# Patient Record
Sex: Male | Born: 1950 | Race: Black or African American | Hispanic: No | Marital: Single | Smoking: Never smoker
Health system: Southern US, Community
[De-identification: ages and names within clinical notes are randomized; demographics above are authoritative.]

## PROBLEM LIST (undated history)

## (undated) DIAGNOSIS — I10 Essential (primary) hypertension: Secondary | ICD-10-CM

## (undated) DIAGNOSIS — H9193 Unspecified hearing loss, bilateral: Secondary | ICD-10-CM

## (undated) HISTORY — PX: ABDOMINAL SURGERY: SHX537

---

## 2013-04-30 ENCOUNTER — Encounter (HOSPITAL_COMMUNITY): Payer: Self-pay | Admitting: Emergency Medicine

## 2013-04-30 ENCOUNTER — Emergency Department (HOSPITAL_COMMUNITY)
Admission: EM | Admit: 2013-04-30 | Discharge: 2013-04-30 | Disposition: A | Discharge status: Home / Self Care | Payer: Self-pay | Attending: Emergency Medicine | Admitting: Emergency Medicine

## 2013-04-30 DIAGNOSIS — R61 Generalized hyperhidrosis: Secondary | ICD-10-CM | POA: Insufficient documentation

## 2013-04-30 DIAGNOSIS — M542 Cervicalgia: Secondary | ICD-10-CM | POA: Insufficient documentation

## 2013-04-30 DIAGNOSIS — R51 Headache: Secondary | ICD-10-CM | POA: Insufficient documentation

## 2013-04-30 MED ORDER — IBUPROFEN 800 MG PO TABS: 800.0000 mg | ORAL_TABLET | Freq: Three times a day (TID) | ORAL | Status: DC

## 2013-04-30 MED ORDER — METHOCARBAMOL 500 MG PO TABS: 500.0000 mg | ORAL_TABLET | Freq: Two times a day (BID) | ORAL | Status: DC

## 2013-04-30 NOTE — ED Notes (Addendum)
Pt is deaf states neck has been hurting x  4 days had a bee sting him in [place] in sept has hurt since no injury no fever

## 2013-04-30 NOTE — ED Notes (Signed)
PT IS DEAF. C/o neck pain x 2 days.

## 2013-04-30 NOTE — ED Provider Notes (Signed)
CSN: [id number]     Arrival date & time 04/30/13  1319 History  This chart was scribed for Bobby Sites, PA working with Bobby Canal, MD by Quintella Reichert, ED Scribe. This patient was seen in room TR07C/TR07C and the patient's care was started at 3:22 PM.   Chief Complaint  Patient presents with  . Neck Pain    The history is provided by the patient and the spouse. The history is limited by a language barrier (pt is deaf). A language interpreter was used.   HPI Comments: Bobby Griffith is a 62 y.o. male who presents to the Emergency Department complaining of 4 days of moderate neck pain.  Hx obtained using sign language interpreter.  Pt states he was stung on the back of his neck by a bee in September and pain has been intensifying ever since.  Pain is worse with movement in any direction and seems to be radiating down the back of his neck.  States he has intermittent headaches over the past month and has felt sweaty at times but has not checked his temperature.  States he let his wife massage his neck but this made pain worse.  Has been having difficulty sleeping due to pain.  Denies any numbness or paresthesias of UE.  No other neck injuries.  No prior neck problems.  Has been taking ibuprofen at home without significant relief.  Denies any dizziness, confusion, unsteady gait, or visual disturbance.  Pt wears glasses at all times.  Pt afebrile on arrival.  History reviewed. No pertinent past medical history.  History reviewed. No pertinent past surgical history.  No family history on file.   History  Substance Use Topics  . Smoking status: Never Smoker   . Smokeless tobacco: Not on file  . Alcohol Use: No     Review of Systems  Eyes: Negative for visual disturbance.  Musculoskeletal: Positive for neck pain.  Neurological: Positive for headaches.  All other systems reviewed and are negative.     Allergies  Review of patient's allergies indicates no known allergies.  Home  Medications  No current outpatient prescriptions on file.  BP 119/78  Pulse 75  Temp(Src) 97 F (36.1 C) (Oral)  Resp 20  Ht 6' (1.829 m)  Wt 162 lb 3.2 oz (73.573 kg)  BMI 21.99 kg/m2  SpO2 98%  Physical Exam  Nursing note and vitals reviewed. Constitutional: He is oriented to person, place, and time. He appears well-developed and well-nourished. No distress.  HENT:  Head: Normocephalic and atraumatic.  Right Ear: Tympanic membrane and ear Griffith normal.  Left Ear: Tympanic membrane and ear Griffith normal.  Nose: Nose normal.  Mouth/Throat: Uvula is midline, oropharynx is clear and moist and mucous membranes are normal. No oropharyngeal exudate, posterior oropharyngeal edema or posterior oropharyngeal erythema.  Eyes: Conjunctivae and EOM are normal. Pupils are equal, round, and reactive to light.  Neck: Normal range of motion. Neck supple. No rigidity.  No nuchal rigidity; TTP of left posterior neck; full ROM maintained but with some pain, worse with turning his head to the right; strong radial pulses bilaterally; sensation intact; no bite marks identified  Cardiovascular: Normal rate, regular rhythm and normal heart sounds.   Pulmonary/Chest: Effort normal and breath sounds normal.  Musculoskeletal: Normal range of motion.  Neurological: He is alert and oriented to person, place, and time. He has normal strength. He displays no tremor. No cranial nerve deficit or sensory deficit. He displays no seizure activity. Gait normal.  CN grossly intact, moves all extremities appropriately without ataxia, no focal neuro deficits or facial droop appreciated  Skin: Skin is warm and dry. He is not diaphoretic.  Psychiatric: He has a normal mood and affect.  Sign language only    ED Course  Procedures (including critical care time)  DIAGNOSTIC STUDIES: Oxygen Saturation is 98% on room air, normal by my interpretation.    COORDINATION OF CARE: 3:24 PM-Discussed treatment plan which  includes consult with attending physician with pt at bedside and pt agreed to plan.    Labs Review Labs Reviewed - No data to display  Imaging Review No results found.  EKG Interpretation   None       MDM   1. Neck pain    Pt afebrile in the ED, neuro exam without noted deficits.  Some wincing with turning his head, worse when turning to the right.  Case discussed with Dr. Silverio Lay-- advised sx sound musculoskeletal in nature and should try anti-inflammatories and muscle relaxant.  Pt does not have PCP at this time.  If no improvement in the next few days, [facility].  Given strict return precautions should sx worsen or change including inability to turn head/move neck, numbness or weakness of extremities, high fever, or severe headache.  I personally performed the services described in this documentation, which was scribed in my presence. The recorded information has been reviewed and is accurate.  Garlon Hatchet, PA-C 04/30/13 1929

## 2013-04-30 NOTE — ED Provider Notes (Signed)
Medical screening examination/treatment/procedure(s) were performed by non-physician practitioner and as supervising physician I was immediately available for consultation/collaboration.  EKG Interpretation   None         David H Yao, MD 04/30/13 2234 

## 2017-06-17 ENCOUNTER — Encounter (HOSPITAL_COMMUNITY): Payer: Self-pay

## 2017-06-17 ENCOUNTER — Emergency Department (HOSPITAL_COMMUNITY)
Admission: EM | Admit: 2017-06-17 | Discharge: 2017-06-17 | Disposition: A | Discharge status: Home / Self Care | Payer: Medicare Other | Attending: Emergency Medicine | Admitting: Emergency Medicine

## 2017-06-17 ENCOUNTER — Emergency Department (HOSPITAL_COMMUNITY): Payer: Medicare Other

## 2017-06-17 ENCOUNTER — Other Ambulatory Visit: Payer: Self-pay

## 2017-06-17 DIAGNOSIS — R52 Pain, unspecified: Secondary | ICD-10-CM

## 2017-06-17 DIAGNOSIS — M79604 Pain in right leg: Secondary | ICD-10-CM | POA: Insufficient documentation

## 2017-06-17 DIAGNOSIS — L299 Pruritus, unspecified: Secondary | ICD-10-CM | POA: Insufficient documentation

## 2017-06-17 DIAGNOSIS — M7918 Myalgia, other site: Secondary | ICD-10-CM | POA: Insufficient documentation

## 2017-06-17 DIAGNOSIS — S8391XA Sprain of unspecified site of right knee, initial encounter: Secondary | ICD-10-CM | POA: Diagnosis not present

## 2017-06-17 LAB — I-STAT CHEM 8, ED
BUN: 18 mg/dL (ref 6–20)
CALCIUM ION: 1.18 mmol/L (ref 1.15–1.40)
CHLORIDE: 102 mmol/L (ref 101–111)
Creatinine, Ser: 0.9 mg/dL (ref 0.61–1.24)
GLUCOSE: 89 mg/dL (ref 65–99)
HCT: 49 % (ref 39.0–52.0)
Hemoglobin: 16.7 g/dL (ref 13.0–17.0)
POTASSIUM: 4.2 mmol/L (ref 3.5–5.1)
Sodium: 140 mmol/L (ref 135–145)
TCO2: 25 mmol/L (ref 22–32)

## 2017-06-17 MED ORDER — DIPHENHYDRAMINE HCL 25 MG PO TABS: 25.0000 mg | ORAL_TABLET | Freq: Four times a day (QID) | ORAL | 0 refills | Status: DC | PRN

## 2017-06-17 MED ORDER — DOXYCYCLINE HYCLATE 100 MG PO CAPS: 100.0000 mg | ORAL_CAPSULE | Freq: Two times a day (BID) | ORAL | 0 refills | Status: DC

## 2017-06-17 MED ORDER — CYCLOBENZAPRINE HCL 5 MG PO TABS: 5.0000 mg | ORAL_TABLET | Freq: Three times a day (TID) | ORAL | 0 refills | Status: DC | PRN

## 2017-06-17 MED ORDER — CYCLOBENZAPRINE HCL 5 MG PO TABS: 10.0000 mg | ORAL_TABLET | Freq: Two times a day (BID) | ORAL | 0 refills | Status: DC | PRN

## 2017-06-17 MED ORDER — PERMETHRIN 5 % EX CREA: TOPICAL_CREAM | CUTANEOUS | 1 refills | Status: DC

## 2017-06-17 NOTE — ED Notes (Signed)
Sign Language Interpreter Called @ 1700-per RN

## 2017-06-17 NOTE — ED Provider Notes (Signed)
[facility] EMERGENCY [facility] Provider Note   CSN: [id number] Arrival date & time: 06/17/17  1655     History   Chief Complaint Chief Complaint  Patient presents with  . Leg Pain    HPI Bobby Griffith is a 66 y.o. male.  The history is provided by the patient and medical records. A language interpreter was used Heritage manager(Sign Language Interpreter).  Leg Pain   Pertinent negatives include no numbness.   Bobby Griffith is a 66 y.o. male  who presents to the Emergency Department complaining of intermittent right upper thigh pain x 4 days. Patient describes the pain as a cramping sensation, like a charley horse. Symptoms occur more frequently at night but will also occur a few times during the day as well. When he is not having the cramping sensation, the upper leg just feels sore. No known injury or inciting event. Denies history of similar. Patient additionally reports itching all over for the last few days as well. He saw several bugs in his bed so he washed all of his sheets and sprayed disinfectant spray on the mattress.  He pulled 3 or 4 bugs off of his abdomen.  He is unsure what kind of bugs these were.  Itching has somewhat improved, but still bothering him.  No one else with similar symptoms.  No medications taken prior to arrival for symptoms.  No fever, chills, numbness, tingling, weakness, abdominal pain, nausea, vomiting, back pain.   History reviewed. No pertinent past medical history.  There are no active problems to display for this patient.   History reviewed. No pertinent surgical history.     Home Medications    Prior to Admission medications   Medication Sig Start Date End Date Taking? Authorizing Provider  cyclobenzaprine (FLEXERIL) 5 MG tablet Take 1 tablet (5 mg total) by mouth 3 (three) times daily as needed for muscle spasms. 06/17/17   Ward, Chase PicketJaime Pilcher, PA-C  diphenhydrAMINE (BENADRYL) 25 MG tablet Take 1 tablet (25 mg total) by mouth  every 6 (six) hours as needed for itching. 06/17/17   Ward, Chase PicketJaime Pilcher, PA-C  doxycycline (VIBRAMYCIN) 100 MG capsule Take 1 capsule (100 mg total) by mouth 2 (two) times daily. 06/17/17   Ward, Chase PicketJaime Pilcher, PA-C  permethrin (ELIMITE) 5 % cream Apply to entire body from neck down at night. Rinse off in the morning. Repeat in 1 week if symptoms are not resolved. 06/17/17   Ward, Chase PicketJaime Pilcher, PA-C    Family History History reviewed. No pertinent family history.  Social History Social History   Tobacco Use  . Smoking status: Not on file  Substance Use Topics  . Alcohol use: No    Frequency: Never  . Drug use: Not on file     Allergies   Patient has no known allergies.   Review of Systems Review of Systems  Constitutional: Negative for chills, fatigue and fever.  Respiratory: Negative for shortness of breath.   Gastrointestinal: Negative for abdominal pain, nausea and vomiting.  Musculoskeletal: Positive for myalgias. Negative for back pain.  Skin: Positive for rash. Negative for color change.  Neurological: Negative for dizziness, weakness, numbness and headaches.     Physical Exam Updated Vital Signs BP (!) 149/98 (BP Location: Right Arm)   Pulse (!) 54   Temp 98.1 F (36.7 C) (Oral)   Resp 16   SpO2 99%   Physical Exam  Constitutional: He appears well-developed and well-nourished. No distress.  HENT:  Head: Normocephalic and  atraumatic.  Neck: Neck supple.  Cardiovascular: Normal rate, regular rhythm and normal heart sounds.  No murmur heard. Pulmonary/Chest: Effort normal and breath sounds normal. No respiratory distress. He has no wheezes. He has no rales.  Abdominal: Soft. He exhibits no distension. There is no tenderness.  Musculoskeletal:       Legs: Tenderness to palpation as depicted in image.  No overlying skin changes.  5/5 muscle strength and full range of motion of bilateral lower extremities.  2+ DP.  Sensation intact.  Neurological: He is  alert.  Skin: Skin is warm and dry. No rash noted.  Nursing note and vitals reviewed.    ED Treatments / Results  Labs (all labs ordered are listed, but only abnormal results are displayed) Labs Reviewed  I-STAT CHEM 8, ED    EKG  EKG Interpretation None       Radiology Dg Femur, Min 2 Views Right  Result Date: 06/17/2017 CLINICAL DATA:  Twisted leg at work EXAM: RIGHT FEMUR 2 VIEWS COMPARISON:  None. FINDINGS: No fracture or malalignment. Posterior cortical lucency mid femoral shaft on the lateral view, favor nutrient vessel. Soft tissues are unremarkable. IMPRESSION: No acute osseous abnormality. Electronically Signed   By: Jasmine PangKim  Fujinaga M.D.   On: 06/17/2017 18:45    Procedures Procedures (including critical care time)  Medications Ordered in ED Medications - No data to display   Initial Impression / Assessment and Plan / ED Course  I have reviewed the triage vital signs and the nursing notes.  Pertinent labs & imaging results that were available during my care of the patient were reviewed by me and considered in my medical decision making (see chart for details).    Bobby Griffith is a 66 y.o. male who presents to ED for intermittent right anterior thigh cramping x 4 days. Tenderness to anterior aspect of the leg, but no overlying skin changes. Full ROM and 5/5 strength. NVI. X-ray negative. Chem-8 wdl. Discussed symptomatic home care instructions. Rx for flexeril. Increase hydration, rest. Patient also endorses generalized itching and seeing small bugs in his bed. He then describes pulling 3 or 4 bugs off of his abdomen. No rash on exam but does have small scarring to the abdomen in the areas where he states he pulled bugs off. Hard to differentiate possible tick vs. Bed bugs. Will cover for both and give rx for doxy as well as permethrin.   PCP follow up strongly encouraged and resources provided in discharge summary. Return precautions discussed with interpreter. All  questions answered.   Patient seen by and discussed with Dr. Clayborne DanaMesner who agrees with treatment plan.    Final Clinical Impressions(s) / ED Diagnoses   Final diagnoses:  Right leg pain  Itching    ED Discharge Orders        Ordered    permethrin (ELIMITE) 5 % cream     06/17/17 2036    diphenhydrAMINE (BENADRYL) 25 MG tablet  Every 6 hours PRN     06/17/17 2036    cyclobenzaprine (FLEXERIL) 5 MG tablet  2 times daily PRN,   Status:  Discontinued     06/17/17 2036    cyclobenzaprine (FLEXERIL) 5 MG tablet  3 times daily PRN     06/17/17 2037    doxycycline (VIBRAMYCIN) 100 MG capsule  2 times daily     06/17/17 2039       Ward, Chase PicketJaime Pilcher, PA-C 06/17/17 2046    Mesner, Barbara CowerJason, MD 06/18/17 [id number]

## 2017-06-17 NOTE — ED Notes (Signed)
Pt called x1 with no answer.  

## 2017-06-17 NOTE — Discharge Instructions (Signed)
It was my pleasure taking care of you today!   Fortunately, your x-ray and blood work today was normal.   Flexeril (cyclobenzaprine) is your muscle relaxer which you can take up to 3 times a day as needed for your right leg pain.  Drinking more water and resting will also help with this.  You can apply ice or heat for additional pain relief.  Please take all of your antibiotics until finished! This is to make sure the bites do not get infected or cause complications.  Benadryl as needed for itching.  Permethrin cream as directed.   Please follow-up with your primary doctor if symptoms persist.  If you do not have a primary care doctor, please see the information below.  Return to ER for new or worsening symptoms, any additional concerns.  To find a primary care or specialty doctor please call [phone] or [phone] to access "[facility] Find a Doctor Service."  You may also go on the [facility] website at [email or web address]  There are also multiple [facility] and [facility] practices throughout the [place] that are frequently accepting new patients. You may find a clinic that is close to your home and contact them.  [facility] - [address] [place] [id number] [phone]  [facility] in [place] (also locations in [place] and [place]) - [address] [place] [place] [phone]  [facility] - [address] [place] [phone]

## 2017-06-17 NOTE — ED Triage Notes (Signed)
Using [employer] interpreter, pt states he twisted his leg at work on Wednesday and has had persistent pain. Pt ambulatory with a limp. Pt denies any other health problems or allergies to medications.

## 2017-06-17 NOTE — ED Triage Notes (Signed)
Pt is deaf. In person interpreter has not arrived yet and the [employer] interpreter is not working. Pt has a note with him that say she is here for leg pain, pt points to the right femur.

## 2017-06-17 NOTE — ED Notes (Signed)
Patient transported to X-ray 

## 2017-06-18 DIAGNOSIS — Z23 Encounter for immunization: Secondary | ICD-10-CM | POA: Diagnosis not present

## 2018-02-20 ENCOUNTER — Observation Stay (HOSPITAL_COMMUNITY)
Admission: EM | Admit: 2018-02-20 | Discharge: 2018-02-23 | Disposition: A | Discharge status: Home / Self Care | Payer: Medicare Other | Attending: Internal Medicine | Admitting: Internal Medicine

## 2018-02-20 DIAGNOSIS — R300 Dysuria: Secondary | ICD-10-CM | POA: Insufficient documentation

## 2018-02-20 DIAGNOSIS — R079 Chest pain, unspecified: Secondary | ICD-10-CM | POA: Diagnosis not present

## 2018-02-20 DIAGNOSIS — R410 Disorientation, unspecified: Secondary | ICD-10-CM | POA: Diagnosis not present

## 2018-02-20 DIAGNOSIS — Z9889 Other specified postprocedural states: Secondary | ICD-10-CM | POA: Insufficient documentation

## 2018-02-20 DIAGNOSIS — I10 Essential (primary) hypertension: Secondary | ICD-10-CM | POA: Diagnosis not present

## 2018-02-20 DIAGNOSIS — R112 Nausea with vomiting, unspecified: Secondary | ICD-10-CM | POA: Diagnosis not present

## 2018-02-20 DIAGNOSIS — Z79899 Other long term (current) drug therapy: Secondary | ICD-10-CM | POA: Diagnosis not present

## 2018-02-20 DIAGNOSIS — I5030 Unspecified diastolic (congestive) heart failure: Secondary | ICD-10-CM | POA: Insufficient documentation

## 2018-02-20 DIAGNOSIS — I208 Other forms of angina pectoris: Secondary | ICD-10-CM | POA: Diagnosis present

## 2018-02-20 DIAGNOSIS — I25118 Atherosclerotic heart disease of native coronary artery with other forms of angina pectoris: Principal | ICD-10-CM | POA: Insufficient documentation

## 2018-02-20 DIAGNOSIS — R0789 Other chest pain: Secondary | ICD-10-CM | POA: Diagnosis not present

## 2018-02-20 DIAGNOSIS — I2584 Coronary atherosclerosis due to calcified coronary lesion: Secondary | ICD-10-CM | POA: Diagnosis not present

## 2018-02-20 DIAGNOSIS — H9190 Unspecified hearing loss, unspecified ear: Secondary | ICD-10-CM | POA: Diagnosis not present

## 2018-02-20 DIAGNOSIS — Z202 Contact with and (suspected) exposure to infections with a predominantly sexual mode of transmission: Secondary | ICD-10-CM | POA: Diagnosis not present

## 2018-02-20 DIAGNOSIS — L299 Pruritus, unspecified: Secondary | ICD-10-CM | POA: Diagnosis not present

## 2018-02-20 HISTORY — DX: Unspecified hearing loss, bilateral: H91.93

## 2018-02-20 MED ORDER — NITROGLYCERIN 0.4 MG SL SUBL: 0.4000 mg | SUBLINGUAL_TABLET | SUBLINGUAL | Status: DC | PRN

## 2018-02-20 NOTE — ED Provider Notes (Signed)
[facility] EMERGENCY DEPARTMENT Provider Note   CSN: [id number] Arrival date & time: 02/20/18  2320     History   Chief Complaint No chief complaint on file.   HPI Bobby Griffith is a 67 y.o. male.  The history is provided by the patient. A language interpreter was used Estate manager/land agent).  He had onset at 10:30 PM of severe chest pain radiating to the left shoulder.  He describes it as a burning feeling.  It was worse with walking, better when he sat and rested.  There was diaphoresis but no dyspnea or nausea.  He is never had pain like this before.  He tried taking acetaminophen without relief.  Ambulance gave him aspirin and nitroglycerin with moderate relief.  He currently rates his discomfort at 5/10.  He is a non-smoker without any history of diabetes or hypertension or hyperlipidemia.  There is no family history of premature coronary atherosclerosis.  No past medical history on file.  There are no active problems to display for this patient.   No past surgical history on file.      Home Medications    Prior to Admission medications   Medication Sig Start Date End Date Taking? Authorizing Provider  ibuprofen (ADVIL,MOTRIN) 800 MG tablet Take 1 tablet (800 mg total) by mouth 3 (three) times daily. 04/30/13   Garlon Hatchet, PA-C  methocarbamol (ROBAXIN) 500 MG tablet Take 1 tablet (500 mg total) by mouth 2 (two) times daily. 04/30/13   Garlon Hatchet, PA-C    Family History No family history on file.  Social History Social History   Tobacco Use  . Smoking status: Never Smoker  Substance Use Topics  . Alcohol use: No  . Drug use: Not on file     Allergies   Patient has no known allergies.   Review of Systems Review of Systems  All other systems reviewed and are negative.    Physical Exam Updated Vital Signs BP (!) 153/86 (BP Location: Right Arm)   Pulse 77   Temp 98.7 F (37.1 C) (Oral)   Resp (!) 21   SpO2 100%    Physical Exam  Nursing note and vitals reviewed.  67 year old male, resting comfortably and in no acute distress. Vital signs are significant for elevated systolic blood pressure. Oxygen saturation is 100%, which is normal. Head is normocephalic and atraumatic. PERRLA, EOMI. Oropharynx is clear. Neck is nontender and supple without adenopathy or JVD. Back is nontender and there is no CVA tenderness. Lungs are clear without rales, wheezes, or rhonchi. Chest is nontender. Heart has regular rate and rhythm without murmur. Abdomen is soft, flat, nontender without masses or hepatosplenomegaly and peristalsis is normoactive. Extremities have no cyanosis or edema, full range of motion is present. Skin is warm and dry without rash. Neurologic: Mental status is normal, cranial nerves are intact, there are no motor or sensory deficits.  ED Treatments / Results  Labs (all labs ordered are listed, but only abnormal results are displayed) Labs Reviewed  BASIC METABOLIC PANEL - Abnormal; Notable for the following components:      Result Value   Glucose, Bld 103 (*)    All other components within normal limits  CBC WITH DIFFERENTIAL/PLATELET  I-STAT TROPONIN, ED    EKG EKG Interpretation  Date/Time:  Monday February 20 2018 23:33:17 EDT Ventricular Rate:  83 PR Interval:    QRS Duration: 93 QT Interval:  386 QTC Calculation: 454 R Axis:  74 Text Interpretation:  Sinus rhythm Left atrial enlargement Borderline repolarization abnormality No old tracing to compare Confirmed by Dione BoozeGlick, Kynnedy Carreno ([id number]) on 02/20/2018 11:46:41 PM   Radiology Dg Chest 2 View  Result Date: 02/21/2018 CLINICAL DATA:  67 y/o M; centralized chest pain radiating to the left shoulder. EXAM: CHEST - 2 VIEW COMPARISON:  None. FINDINGS: The heart size and mediastinal contours are within normal limits. Both lungs are clear. The visualized skeletal structures are unremarkable. IMPRESSION: No active cardiopulmonary disease.  Electronically Signed   By: Mitzi HansenLance  Furusawa-Stratton M.D.   On: 02/21/2018 00:25    Procedures Procedures  Medications Ordered in ED Medications  nitroGLYCERIN (NITROSTAT) SL tablet 0.4 mg (has no administration in time range)     Initial Impression / Assessment and Plan / ED Course  I have reviewed the triage vital signs and the nursing notes.  Pertinent labs & imaging results that were available during my care of the patient were reviewed by me and considered in my medical decision making (see chart for details).  Chest discomfort of uncertain cause.  With exertional component, certainly need to be worried about possible coronary artery disease.  He has no known risk factors, but heart score is 4 which puts him at moderate risk for major adverse cardiac events in the next 6 weeks.  ECG does show some nonspecific repolarization abnormalities with no prior ECGs available for comparison.  Old records are reviewed, and he has no relevant past records.  Given history and borderline ECG changes, I do feel he will need to be admitted for serial troponins.  2:58 AM Patient states that he is pain-free.  As a matter of fact, I had to wake him up to ask him if he was still having pain.  Initial troponin is normal.  Chest x-ray is unremarkable.  Case is discussed with Dr. Katrinka BlazingSmith of [facility], who agrees to admit the patient.  Final Clinical Impressions(s) / ED Diagnoses   Final diagnoses:  Chest pain, unspecified type    ED Discharge Orders    None       Dione BoozeGlick, Georgiann Neider, MD 02/21/18 [phone]

## 2018-02-20 NOTE — ED Triage Notes (Signed)
Per EMS, pt has hx of being deaf and is coming to ER today with complaints of centralized chest pain that radiates to his left shoulder. Pt stated it started around 1030 pm after he ate. Pt denies N/V/D. Pt received x1 nitro for pain and 324 ASA. Pt pain is 5/10.

## 2018-02-21 ENCOUNTER — Encounter (HOSPITAL_COMMUNITY): Payer: Self-pay | Admitting: Internal Medicine

## 2018-02-21 ENCOUNTER — Emergency Department (HOSPITAL_COMMUNITY): Payer: Medicare Other

## 2018-02-21 ENCOUNTER — Observation Stay (HOSPITAL_COMMUNITY): Payer: Self-pay

## 2018-02-21 ENCOUNTER — Other Ambulatory Visit: Payer: Self-pay

## 2018-02-21 ENCOUNTER — Observation Stay (HOSPITAL_BASED_OUTPATIENT_CLINIC_OR_DEPARTMENT_OTHER): Payer: Medicare Other

## 2018-02-21 DIAGNOSIS — H9190 Unspecified hearing loss, unspecified ear: Secondary | ICD-10-CM | POA: Diagnosis not present

## 2018-02-21 DIAGNOSIS — I2584 Coronary atherosclerosis due to calcified coronary lesion: Secondary | ICD-10-CM | POA: Diagnosis not present

## 2018-02-21 DIAGNOSIS — Z79899 Other long term (current) drug therapy: Secondary | ICD-10-CM | POA: Diagnosis not present

## 2018-02-21 DIAGNOSIS — I25118 Atherosclerotic heart disease of native coronary artery with other forms of angina pectoris: Secondary | ICD-10-CM | POA: Diagnosis not present

## 2018-02-21 DIAGNOSIS — R079 Chest pain, unspecified: Secondary | ICD-10-CM | POA: Diagnosis present

## 2018-02-21 DIAGNOSIS — Z9889 Other specified postprocedural states: Secondary | ICD-10-CM | POA: Diagnosis not present

## 2018-02-21 DIAGNOSIS — R300 Dysuria: Secondary | ICD-10-CM | POA: Diagnosis not present

## 2018-02-21 DIAGNOSIS — Z202 Contact with and (suspected) exposure to infections with a predominantly sexual mode of transmission: Secondary | ICD-10-CM | POA: Diagnosis not present

## 2018-02-21 DIAGNOSIS — L299 Pruritus, unspecified: Secondary | ICD-10-CM | POA: Diagnosis not present

## 2018-02-21 DIAGNOSIS — I5030 Unspecified diastolic (congestive) heart failure: Secondary | ICD-10-CM | POA: Diagnosis not present

## 2018-02-21 LAB — URINALYSIS, ROUTINE W REFLEX MICROSCOPIC
Bilirubin Urine: NEGATIVE
GLUCOSE, UA: NEGATIVE mg/dL
Hgb urine dipstick: NEGATIVE
KETONES UR: NEGATIVE mg/dL
LEUKOCYTES UA: NEGATIVE
Nitrite: NEGATIVE
PH: 6 (ref 5.0–8.0)
Protein, ur: NEGATIVE mg/dL
SPECIFIC GRAVITY, URINE: 1.006 (ref 1.005–1.030)

## 2018-02-21 LAB — BASIC METABOLIC PANEL
Anion gap: 9 (ref 5–15)
BUN: 19 mg/dL (ref 8–23)
CHLORIDE: 106 mmol/L (ref 98–111)
CO2: 25 mmol/L (ref 22–32)
Calcium: 9.2 mg/dL (ref 8.9–10.3)
Creatinine, Ser: 1.21 mg/dL (ref 0.61–1.24)
GFR calc non Af Amer: >60 mL/min (ref >60)
Glucose, Bld: 103 mg/dL — ABNORMAL HIGH (ref 70–99)
POTASSIUM: 4.2 mmol/L (ref 3.5–5.1)
Sodium: 140 mmol/L (ref 135–145)

## 2018-02-21 LAB — CBC WITH DIFFERENTIAL/PLATELET
Abs Immature Granulocytes: 0 10*3/uL (ref 0.0–0.1)
Basophils Absolute: 0 10*3/uL (ref 0.0–0.1)
Basophils Relative: 0 %
Eosinophils Absolute: 0.1 10*3/uL (ref 0.0–0.7)
Eosinophils Relative: 1 %
HEMATOCRIT: 45.6 % (ref 39.0–52.0)
Hemoglobin: 15 g/dL (ref 13.0–17.0)
Immature Granulocytes: 0 %
LYMPHS ABS: 1.3 10*3/uL (ref 0.7–4.0)
LYMPHS PCT: 29 %
MCH: 31.8 pg (ref 26.0–34.0)
MCHC: 32.9 g/dL (ref 30.0–36.0)
MCV: 96.8 fL (ref 78.0–100.0)
MONOS PCT: 10 %
Monocytes Absolute: 0.4 10*3/uL (ref 0.1–1.0)
NEUTROS PCT: 60 %
Neutro Abs: 2.6 10*3/uL (ref 1.7–7.7)
Platelets: 195 10*3/uL (ref 150–400)
RBC: 4.71 MIL/uL (ref 4.22–5.81)
RDW: 13.6 % (ref 11.5–15.5)
WBC: 4.4 10*3/uL (ref 4.0–10.5)

## 2018-02-21 LAB — LIPID PANEL
Cholesterol: 162 mg/dL (ref 0–200)
HDL: 36 mg/dL — AB (ref >40)
LDL Cholesterol: 107 mg/dL — ABNORMAL HIGH (ref 0–99)
TRIGLYCERIDES: 97 mg/dL (ref <150)
Total CHOL/HDL Ratio: 4.5 RATIO
VLDL: 19 mg/dL (ref 0–40)

## 2018-02-21 LAB — TROPONIN I
Troponin I: <0.03 ng/mL (ref <0.03)
Troponin I: <0.03 ng/mL (ref <0.03)
Troponin I: <0.03 ng/mL (ref <0.03)

## 2018-02-21 LAB — NM MYOCAR MULTI W/SPECT W/WALL MOTION / EF
CSEPED: 5 min
CSEPEW: 1 METS
CSEPPHR: 96 {beats}/min
Exercise duration (sec): 16 s
Rest HR: 60 {beats}/min

## 2018-02-21 LAB — I-STAT TROPONIN, ED: Troponin i, poc: 0.03 ng/mL (ref 0.00–0.08)

## 2018-02-21 LAB — D-DIMER, QUANTITATIVE: D-Dimer, Quant: 0.35 ug/mL-FEU (ref 0.00–0.50)

## 2018-02-21 MED ORDER — ACETAMINOPHEN 325 MG PO TABS: 650.0000 mg | ORAL_TABLET | ORAL | Status: DC | PRN

## 2018-02-21 MED ORDER — TECHNETIUM TC 99M TETROFOSMIN IV KIT
30.0000 | PACK | Freq: Once | INTRAVENOUS | Status: AC | PRN
  Administered 2018-02-21: 30 via INTRAVENOUS

## 2018-02-21 MED ORDER — ENOXAPARIN SODIUM 40 MG/0.4ML ~~LOC~~ SOLN
40.0000 mg | SUBCUTANEOUS | Status: DC
  Administered 2018-02-22: 10:00:00 40 mg via SUBCUTANEOUS
  Filled 2018-02-21: qty 0.4

## 2018-02-21 MED ORDER — ONDANSETRON HCL 4 MG/2ML IJ SOLN: 4.0000 mg | Freq: Four times a day (QID) | INTRAMUSCULAR | Status: DC | PRN

## 2018-02-21 MED ORDER — AZITHROMYCIN 500 MG PO TABS
1000.0000 mg | ORAL_TABLET | Freq: Once | ORAL | Status: AC
  Administered 2018-02-21: 1000 mg via ORAL
  Filled 2018-02-21: qty 2

## 2018-02-21 MED ORDER — TECHNETIUM TC 99M TETROFOSMIN IV KIT
10.0000 | PACK | Freq: Once | INTRAVENOUS | Status: AC | PRN
  Administered 2018-02-21: 10 via INTRAVENOUS

## 2018-02-21 MED ORDER — DEXTROSE 5 % IV SOLN
250.0000 mg | Freq: Once | INTRAVENOUS | Status: AC
  Administered 2018-02-21: 250 mg via INTRAVENOUS
  Filled 2018-02-21: qty 250

## 2018-02-21 MED ORDER — METRONIDAZOLE 500 MG PO TABS
2000.0000 mg | ORAL_TABLET | Freq: Once | ORAL | Status: AC
  Administered 2018-02-21: 2000 mg via ORAL
  Filled 2018-02-21: qty 4

## 2018-02-21 MED ORDER — GI COCKTAIL ~~LOC~~: 30.0000 mL | Freq: Four times a day (QID) | ORAL | Status: DC | PRN

## 2018-02-21 MED ORDER — METHOCARBAMOL 500 MG PO TABS: 500.0000 mg | ORAL_TABLET | Freq: Two times a day (BID) | ORAL | Status: DC

## 2018-02-21 MED ORDER — MORPHINE SULFATE (PF) 2 MG/ML IV SOLN: 2.0000 mg | INTRAVENOUS | Status: DC | PRN

## 2018-02-21 MED ORDER — REGADENOSON 0.4 MG/5ML IV SOLN
INTRAVENOUS | Status: AC
  Filled 2018-02-21: qty 5

## 2018-02-21 MED ORDER — REGADENOSON 0.4 MG/5ML IV SOLN
0.4000 mg | Freq: Once | INTRAVENOUS | Status: AC
  Administered 2018-02-21: 0.4 mg via INTRAVENOUS
  Filled 2018-02-21: qty 5

## 2018-02-21 MED ORDER — METHOCARBAMOL 500 MG PO TABS: 500.0000 mg | ORAL_TABLET | Freq: Two times a day (BID) | ORAL | Status: DC | PRN

## 2018-02-21 NOTE — Progress Notes (Signed)
PROGRESS NOTE  Brief Narrative: Bobby Griffith is a healthy, deaf 67yo male who was admitted this morning for ACS evaluation after presenting with several weeks of intermittent sharp left sided chest pain radiating to the shoulder described as severe, not improved with OTC medications, and happens only with exertion, relieved by resting. He has no major DVT risk factors and no significant cardiac risk factors (HTN, HLD, DM, obesity, or +FH). ASA and NTG provided en route to ED improved pain. Labs were unremarkable with initial troponin 0.03, ECG showing some artifact and nonspecific ST changes (slight depression inferiorly and J point elevation in V1 and V2). Cardiac enzymes have remained undetectable, repeat ECG shows no significant ST changes, sinus arrhythmia borderline bradycardia. Chest pain is resolved.   Subjective: Major complaint this morning is burning at the tip of the penis with urination. Denies testicular pain, hematuria, abd pain, penile discharge. Single male partner was evaluated for STI a month previously, treated for unknown infections, and was told not to have sex with the patient until he gets treated. Also reports erectile dysfunction and inquiring about levitra. Also reports diffuse itching with bug bites, having had his apartment treated for bed bugs.   Objective: BP (!) 141/102 (BP Location: Left Arm)   Pulse 61   Temp 98 F (36.7 C) (Oral)   Resp 16   Ht 6' (1.829 m)   Wt 73.8 kg   SpO2 96%   BMI 22.08 kg/m   Gen: WDWN, well-appearing male in no distress Pulm: Clear and nonlabored on room air  CV: RRR, no murmur, no JVD, no edema GI: Soft, NT, ND, +BS  GU: Normal uncircumcised penis without wounds, abrasion or lesions. Testicles normal. Neuro: Alert and oriented. No focal deficits. Skin: Diffuse pruritus   Assessment & Plan: Exertional chest pain:  - Complete cycle of cardiac enzymes - Echocardiogram pending. No sxs of HF. - Cardiology consulted. Will keep  NPO in case of need for invasive testing.  STI exposure and dysuria:  - Check UA - Empiric Tx w/CTX, azithro, flagyl x1. Will send for GC/Chl and follow up HIV Ab (pending)  Pruritus: Most attributable to bed bugs. Counseling provided. Pt has had extermination services.  - Symptomatic Tx  Note: Throughout encounter, video ASL interpretor Bobby Griffith #[id number], facilitated communication.  Tyrone Nineyan B Danelle Curiale, MD 02/21/2018, 10:36 AM

## 2018-02-21 NOTE — ED Notes (Signed)
If patient needs transportation Please call [facility] [phone] per EMS

## 2018-02-21 NOTE — Progress Notes (Signed)
Pt had returned from NM stress test, asking when he will go home and about food, paged Dr Jarvis NewcomerGrunz re: diet order, pt educated on waiting for test results

## 2018-02-21 NOTE — Progress Notes (Signed)
  Echocardiogram 2D Echocardiogram has been attempted 2x. Patient not in Room.  Bobby Griffith 02/21/2018, 3:10 PM

## 2018-02-21 NOTE — Progress Notes (Signed)
Pt given paper and pen as requested, he reports a little burning when he pees. He asking if HIV or VD could cause? When asked if he had before and he points to VD and when asked if similar then shook head yes. I asked which VD but was not able to tell me, paged Dr Jarvis NewcomerGrunz with pt c/o and concern, pt told Francisca he had a docotor but not sure who is was

## 2018-02-21 NOTE — Progress Notes (Signed)
To the best of my knowledge, documentation by R Patel, [employer] nursing student, is correct.  

## 2018-02-21 NOTE — ED Notes (Signed)
Patient transported to X-ray 

## 2018-02-21 NOTE — Progress Notes (Signed)
Patient presented for Lexiscan. Tolerated procedure well. Pending final stress imaging result.  

## 2018-02-21 NOTE — Consult Note (Addendum)
Cardiology Consultation:   Patient ID: Bobby Griffith; [id number]; 12/22/1950   Admit date: 02/20/2018 Date of Consult: 02/21/2018  Primary Care Provider: Patient, No Pcp Griffith Primary Cardiologist: New to Dr. Herbie Griffith    Patient Profile:   Bobby Griffith is a 67 y.o. 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 Jarvis Griffith.   History of Present Illness:   Bobby Griffith presented for acute onset chest pain. Patient is deaf. Sign language interpreter was used.  He has intermittent chest pain yesterday while walking lasting for about 1 minutes each time. Relived with rest. However, later his pain intensify. 10/10 with radiating to left arm. Associated with SOB. Denies nausea, vomiting or diaphoresis. Friend called EMS>> given sl nitro x 1 with resolution of pain. Her has mild reoccurrence of pain intermittently since admit. Describes his pain as "squeezing burning pressure". Also also ha has intermittent palpitation yesterday however separate from his chest pain episode.  Denies prior history of syncope or chest pain.  Denies orthopnea, PND, syncope, lower extremity edema or melena.  No use of illicit drug or tobacco abuse.  Occasional drinking.  Denies family history of CAD.  Not take any medication regularly.  LDL 107.  Troponin has been negative.  Electrolyte and serum creatinine normal.  D-dimer negative.  Chest x-ray without acute cardiopulmonary disease.  Pending echocardiogram.  Started on antibiotic for potential STI exposure.  Pending labs.  Past Medical History:  Diagnosis Date  . Deaf, bilateral     History reviewed. No pertinent surgical history.    Inpatient Medications: Scheduled Meds: . azithromycin  1,000 mg Oral Once  . enoxaparin (LOVENOX) injection  40 mg Subcutaneous Q24H  . metroNIDAZOLE  2,000 mg Oral Once   Continuous Infusions: . cefTRIAXone (ROCEPHIN)  IV     PRN  Meds: acetaminophen, gi cocktail, methocarbamol, morphine injection, nitroGLYCERIN, ondansetron (ZOFRAN) IV  Allergies:   No Known Allergies  Social History:   Social History   Socioeconomic History  . Marital status: Single    Spouse name: Not on file  . Number of children: Not on file  . Years of education: Not on file  . Highest education level: Not on file  Occupational History  . Not on file  Social Needs  . Financial resource strain: Not on file  . Food insecurity:    Worry: Not on file    Inability: Not on file  . Transportation needs:    Medical: Not on file    Non-medical: Not on file  Tobacco Use  . Smoking status: Never Smoker  . Smokeless tobacco: Never Used  Substance and Sexual Activity  . Alcohol use: No  . Drug use: Not on file  . Sexual activity: Not on file  Lifestyle  . Physical activity:    Days Griffith week: Not on file    Minutes Griffith session: Not on file  . Stress: Not on file  Relationships  . Social connections:    Talks on phone: Not on file    Gets together: Not on file    Attends religious service: Not on file    Active member of club or organization: Not on file    Attends meetings of clubs or organizations: Not on file    Relationship status: Not on file  . Intimate partner violence:    Fear of current or ex partner: Not on file    Emotionally  abused: Not on file    Physically abused: Not on file    Forced sexual activity: Not on file  Other Topics Concern  . Not on file  Social History Narrative  . Not on file    Family History:   Denied family history of CAD  ROS:  Please see the history of present illness.  All other ROS reviewed and negative.     Physical Exam/Data:   Vitals:   02/21/18 0459 02/21/18 0500 02/21/18 0806 02/21/18 1009  BP: (!) 142/87  (!) 149/90 (!) 141/102  Pulse: 62  60 61  Resp: 18  18 16   Temp: 97.7 F (36.5 C)  97.9 F (36.6 C) 98 F (36.7 C)  TempSrc: Oral  Oral Oral  SpO2: 99%  97% 96%   Weight:  73.8 kg    Height:  6' (1.829 m)      Intake/Output Summary (Last 24 hours) at 02/21/2018 1115 Last data filed at 02/21/2018 0747 Gross Griffith 24 hour  Intake 0 ml  Output 700 ml  Net -700 ml   Filed Weights   02/21/18 0003 02/21/18 0500  Weight: 73.6 kg 73.8 kg   Body mass index is 22.08 kg/m.  General:  Well nourished, well developed, in no acute distress HEENT: normal Lymph: no adenopathy Neck: no JVD Endocrine:  No thryomegaly Vascular: No carotid bruits; FA pulses 2+ bilaterally without bruits  Cardiac:  normal S1, S2; RRR; no murmur  Lungs:  clear to auscultation bilaterally, no wheezing, rhonchi or rales  Abd: soft, nontender, no hepatomegaly  Ext: no edema Musculoskeletal:  No deformities, BUE and BLE strength normal and equal Skin: warm and dry  Neuro:  CNs 2-12 intact, no focal abnormalities noted Psych:  Normal affect   EKG:  The EKG was personally reviewed and demonstrates: Sinus bradycardia at rate of 55 bpm and PAC. Telemetry:  Telemetry was personally reviewed and demonstrates: Sinus rhythm with PACs and 60s  Relevant CV Studies: Echocardiogram pending  Laboratory Data:  Chemistry Recent Labs  Lab 02/21/18 0025  NA 140  K 4.2  CL 106  CO2 25  GLUCOSE 103*  BUN 19  CREATININE 1.21  CALCIUM 9.2  GFRNONAA >60  GFRAA >60  ANIONGAP 9    Hematology Recent Labs  Lab 02/21/18 0025  WBC 4.4  RBC 4.71  HGB 15.0  HCT 45.6  MCV 96.8  MCH 31.8  MCHC 32.9  RDW 13.6  PLT 195   Cardiac Enzymes Recent Labs  Lab 02/21/18 0408 02/21/18 0638  TROPONINI <0.03 <0.03    Recent Labs  Lab 02/21/18 0029  TROPIPOC 0.03    Radiology/Studies:  Dg Chest 2 View  Result Date: 02/21/2018 CLINICAL DATA:  67 y/o M; centralized chest pain radiating to the left shoulder. EXAM: CHEST - 2 VIEW COMPARISON:  None. FINDINGS: The heart size and mediastinal contours are within normal limits. Both lungs are clear. The visualized skeletal structures are  unremarkable. IMPRESSION: No active cardiopulmonary disease. Electronically Signed   By: Mitzi HansenLance  Furusawa-Stratton M.D.   On: 02/21/2018 00:25    Assessment and Plan:   1.  Exertional chest pain Presented with 1 day history of intermittent chest pain with walking.  Each episode lasting for about 1 minute.  Radiation of his pain to left shoulder with shortness of breath.  Symptoms resolved after sublingual nitroglycerin x1.  He had intermittent recurrent since admit.  Endorsed palpitation but [place].  No tachyarrhythmia noted on telemetry. Troponin negative. EKG without acute  changes. Pending echo. Given unreliable hx will get lexiscan myoview today.   2. Elevated blood pressure - Elevated today. May consider adding antihypertensive.   For questions or updates, please contact [facility] Please consult [email or web address] for contact info under Cardiology/STEMI.   Vonzella Nipple [place], [place]  02/21/2018 11:15 AM    I have seen, examined and evaluated the patient this PM along with Mr. Iver Nestle, [place] - following stress test Griffith our initial discussion.  After reviewing all the available data and chart, we discussed the patients laboratory, study & physical findings as well as symptoms in detail. I agree with hi findings, examination as well as impression recommendations as Griffith our discussion.    I am seeing him post ST - results as follows:  There was no ST segment deviation noted during stress.  No T wave inversion was noted during stress.  Defect 1: There is a medium defect of severe severity present in the basal inferolateral and mid inferolateral location.  Findings consistent with ischemia.  This is an intermediate risk study.  The left ventricular ejection fraction is mildly decreased (45-54%).   Abnormal intermediate risk stress nuclear study with inferior basal thinning and mild to moderate ischemia in the basal inferior lateral wall.  The gated ejection fraction was 48% with  global hypokinesis.   After discussing the patient's symptoms, I do agree that his symptoms were at least moderate risk for cardiac etiology and now that he has had an abnormal stress test showing what appears to be a partially reversible inferior defect that does seem annealed abnormal I think the best course of action is to proceed with cardiac catheterization.  We will put him on a long-acting nitrate, and adjust medications over the course of tomorrow.  Unfortunately the Cath Lab board is too busy for heart catheterization tomorrow.  We will post him for cardiac catheterization on Thursday.  I spent the majority of my time using the sign language interpreter to explain the results of the stress test after discussing his symptoms.  I then explained the heart catheterization procedure with risks, benefits, alternatives and details.  All questions were answered.  The patient does voice understanding and agreed to proceed with cardiac catheterization.  We will see him tomorrow and set him up for catheterization on Thursday.  Bryan Lemma, M.D., M.S. Interventional Cardiologist   Pager # [phone] Phone # [phone] [address] [place], [place] [address]

## 2018-02-21 NOTE — ED Notes (Addendum)
Report given to 3E RN

## 2018-02-21 NOTE — Progress Notes (Signed)
Pt has been NPO since MN, pt denies cp or sob, pt NM for stress test, offered pt urinal but declines,

## 2018-02-21 NOTE — H&P (Signed)
History and Physical    Akili Corsetti BJY:[id number] DOB: 12/22/1950 DOA: 02/20/2018  Referring MD/NP/PA: Dione Booze, MD PCP: Patient, No Pcp Per  Patient coming from: home via EMS  Chief Complaint: chest pain  I have personally briefly reviewed patient's old medical records in [facility]   HPI: Daxson Reffett is a 67 y.o. male with medical history significant of deafness; who presents with complaints of chest pain.  Patient reports symptoms have been present  Intermittently over the last 1 month.  He describes it as a sharp pain in the left chest radiating to his shoulder.  Associated symptoms include some diaphoresis, but denies any shortness of breath or nausea symptoms.  Symptoms worsen with exertion and relieved with resting.  He had previously tried Tylenol without relief of symptoms.  En route with EMS patient was given 325 mg ofaspirin and nitroglycerin with some relief of pain symptoms.  He denies any alcohol, tobacco, or illicit drug use.  He denies any significant family history of heart disease, prolonged immobilization, calf pain, leg swelling, or palpitations.  ED Course: Admission into the emergency department patient was noted to be afebrile, pulse 55-23, respiration 14-25, and all other vitals maintained.  Labs were unremarkable including CBC, BMP, and initial troponin.  EKG was unremarkable.  TRH called to admit for chest pain rule out.  Review of Systems  Constitutional: Positive for diaphoresis. Negative for fever and weight loss.  HENT: Negative for ear discharge and nosebleeds.   Eyes: Negative for double vision and photophobia.  Respiratory: Negative for cough, shortness of breath and wheezing.   Cardiovascular: Positive for chest pain. Negative for leg swelling.  Gastrointestinal: Negative for abdominal pain, nausea and vomiting.  Genitourinary: Negative for dysuria and urgency.  Musculoskeletal: Negative for falls.  Skin: Negative for itching and rash.    Neurological: Negative for focal weakness and loss of consciousness.  Psychiatric/Behavioral: Negative for substance abuse and suicidal ideas.    Past Medical History:  Diagnosis Date  . Deaf, bilateral     History reviewed. No pertinent surgical history.   reports that he has never smoked. He does not have any smokeless tobacco history on file. He reports that he does not drink alcohol. His drug history is not on file.  No Known Allergies  History reviewed. No pertinent family history of known cardiac disease.  Prior to Admission medications   Medication Sig Start Date End Date Taking? Authorizing Provider  ibuprofen (ADVIL,MOTRIN) 800 MG tablet Take 1 tablet (800 mg total) by mouth 3 (three) times daily. 04/30/13   Garlon Hatchet, PA-C  methocarbamol (ROBAXIN) 500 MG tablet Take 1 tablet (500 mg total) by mouth 2 (two) times daily. 04/30/13   Garlon Hatchet, PA-C    Physical Exam:  Constitutional: NAD, calm, comfortable Vitals:   02/21/18 0200 02/21/18 0215 02/21/18 0230 02/21/18 0245  BP: 125/78 121/81 124/76 (!) 141/73  Pulse: 62 71 74 67  Resp: 17 (!) 22 17 15   Temp:      TempSrc:      SpO2: 97% 94% 96% 95%  Weight:      Height:       Eyes: PERRL, lids and conjunctivae normal ENMT: Mucous membranes are moist. Posterior pharynx clear of any exudate or lesions.Normal dentition.  Neck: normal, supple, no masses, no thyromegaly Respiratory: clear to auscultation bilaterally, no wheezing, no crackles. Normal respiratory effort. No accessory muscle use.  Cardiovascular: Regular rate and rhythm, no murmurs / rubs / gallops. No  extremity edema. 2+ pedal pulses. No carotid bruits.  Some mild tenderness to palpation noted of the chest wall Abdomen: no tenderness, no masses palpated. No hepatosplenomegaly. Bowel sounds positive.  Musculoskeletal: no clubbing / cyanosis. No joint deformity upper and lower extremities. Good ROM, no contractures. Normal muscle tone.  Skin: no  rashes, lesions, ulcers. No induration Neurologic: CN 2-12 grossly intact. Sensation intact, DTR normal. Strength 5/5 in all 4.  Psychiatric: Normal judgment and insight. Alert and oriented x 3. Normal mood.     Labs on Admission: I have personally reviewed following labs and imaging studies  CBC: Recent Labs  Lab 02/21/18 0025  WBC 4.4  NEUTROABS 2.6  HGB 15.0  HCT 45.6  MCV 96.8  PLT 195   Basic Metabolic Panel: Recent Labs  Lab 02/21/18 0025  NA 140  K 4.2  CL 106  CO2 25  GLUCOSE 103*  BUN 19  CREATININE 1.21  CALCIUM 9.2   GFR: Estimated Creatinine Clearance: 61.7 mL/min (by C-G formula based on SCr of 1.21 mg/dL). Liver Function Tests: No results for input(s): AST, ALT, ALKPHOS, BILITOT, PROT, ALBUMIN in the last 168 hours. No results for input(s): LIPASE, AMYLASE in the last 168 hours. No results for input(s): AMMONIA in the last 168 hours. Coagulation Profile: No results for input(s): INR, PROTIME in the last 168 hours. Cardiac Enzymes: No results for input(s): CKTOTAL, CKMB, CKMBINDEX, TROPONINI in the last 168 hours. BNP (last 3 results) No results for input(s): PROBNP in the last 8760 hours. HbA1C: No results for input(s): HGBA1C in the last 72 hours. CBG: No results for input(s): GLUCAP in the last 168 hours. Lipid Profile: No results for input(s): CHOL, HDL, LDLCALC, TRIG, CHOLHDL, LDLDIRECT in the last 72 hours. Thyroid Function Tests: No results for input(s): TSH, T4TOTAL, FREET4, T3FREE, THYROIDAB in the last 72 hours. Anemia Panel: No results for input(s): VITAMINB12, FOLATE, FERRITIN, TIBC, IRON, RETICCTPCT in the last 72 hours. Urine analysis: No results found for: COLORURINE, APPEARANCEUR, LABSPEC, PHURINE, GLUCOSEU, HGBUR, BILIRUBINUR, KETONESUR, PROTEINUR, UROBILINOGEN, NITRITE, LEUKOCYTESUR Sepsis Labs: No results found for this or any previous visit (from the past 240 hour(s)).   Radiological Exams on Admission: Dg Chest 2  View  Result Date: 02/21/2018 CLINICAL DATA:  67 y/o M; centralized chest pain radiating to the left shoulder. EXAM: CHEST - 2 VIEW COMPARISON:  None. FINDINGS: The heart size and mediastinal contours are within normal limits. Both lungs are clear. The visualized skeletal structures are unremarkable. IMPRESSION: No active cardiopulmonary disease. Electronically Signed   By: Mitzi HansenLance  Furusawa-Stratton M.D.   On: 02/21/2018 00:25    EKG: Independently reviewed.  Sinus rhythm at 83 bpm  Assessment/Plan Chest pain: Acute.  Patient presents with complaints of chest pain with exertion.  Initial troponin negative with EKG showing nonischemic changes.  - Admit to a telemetry bed - Trend cardiac troponin  - Check lipid panel - Check echocardiogram - Message sent for cardiology evaluation in a.m   DVT prophylaxis:lovenox Code Status: Full Family Communication: no family present   Disposition Plan: Likely discharge home if work-up negative Consults called: none Admission status: observation  Clydie Braunondell A Sina Lucchesi MD [facility] Pager [phone]   If 7PM-7AM, please contact night-coverage [email or web address] Password [email or web address]  02/21/2018, 3:17 AM

## 2018-02-22 ENCOUNTER — Observation Stay (HOSPITAL_BASED_OUTPATIENT_CLINIC_OR_DEPARTMENT_OTHER): Payer: Medicare Other

## 2018-02-22 DIAGNOSIS — I209 Angina pectoris, unspecified: Secondary | ICD-10-CM

## 2018-02-22 DIAGNOSIS — R9439 Abnormal result of other cardiovascular function study: Secondary | ICD-10-CM | POA: Diagnosis not present

## 2018-02-22 DIAGNOSIS — R079 Chest pain, unspecified: Secondary | ICD-10-CM | POA: Diagnosis not present

## 2018-02-22 DIAGNOSIS — I25118 Atherosclerotic heart disease of native coronary artery with other forms of angina pectoris: Secondary | ICD-10-CM | POA: Diagnosis not present

## 2018-02-22 LAB — ECHOCARDIOGRAM COMPLETE
HEIGHTINCHES: 72 in
Weight: 2515.2 oz

## 2018-02-22 MED ORDER — ISOSORBIDE MONONITRATE ER 30 MG PO TB24
30.0000 mg | ORAL_TABLET | Freq: Every day | ORAL | Status: DC
  Administered 2018-02-22 – 2018-02-23 (×2): 30 mg via ORAL
  Filled 2018-02-22 (×2): qty 1

## 2018-02-22 MED ORDER — ATORVASTATIN CALCIUM 40 MG PO TABS
40.0000 mg | ORAL_TABLET | Freq: Every day | ORAL | Status: DC
  Administered 2018-02-22 – 2018-02-23 (×2): 40 mg via ORAL
  Filled 2018-02-22 (×2): qty 1

## 2018-02-22 MED ORDER — ASPIRIN EC 81 MG PO TBEC
81.0000 mg | DELAYED_RELEASE_TABLET | Freq: Every day | ORAL | Status: DC
  Administered 2018-02-22 – 2018-02-23 (×2): 81 mg via ORAL
  Filled 2018-02-22 (×2): qty 1

## 2018-02-22 MED ORDER — SODIUM CHLORIDE 0.9% FLUSH
3.0000 mL | Freq: Two times a day (BID) | INTRAVENOUS | Status: DC
  Administered 2018-02-22 – 2018-02-23 (×3): 3 mL via INTRAVENOUS

## 2018-02-22 MED ORDER — SODIUM CHLORIDE 0.9% FLUSH: 3.0000 mL | INTRAVENOUS | Status: DC | PRN

## 2018-02-22 MED ORDER — SODIUM CHLORIDE 0.9 % IV SOLN
INTRAVENOUS | Status: DC
  Administered 2018-02-23: 06:00:00 via INTRAVENOUS

## 2018-02-22 MED ORDER — SODIUM CHLORIDE 0.9 % IV SOLN: 250.0000 mL | INTRAVENOUS | Status: DC | PRN

## 2018-02-22 NOTE — Progress Notes (Signed)
Progress Note    Toney ReilWilliam Wimer  ZOX:[id number] DOB: 12/22/1950  DOA: 02/20/2018 PCP: Patient, No Pcp Per    Brief Narrative:     Medical records reviewed and are as summarized below:  Toney ReilWilliam Macke is an 67 y.o. male with medical history significant of deafness; who presents with complaints of chest pain.  Patient reports symptoms have been present  Intermittently over the last 1 month.  He describes it as a sharp pain in the left chest radiating to his shoulder.  Associated symptoms include some diaphoresis, but denies any shortness of breath or nausea symptoms.  Symptoms worsen with exertion and relieved with resting.  He had previously tried Tylenol without relief of symptoms.  En route with EMS patient was given 325 mg ofaspirin and nitroglycerin with some relief of pain symptoms.  He denies any alcohol, tobacco, or illicit drug use.  He denies any significant family history of heart disease, prolonged immobilization, calf pain, leg swelling, or palpitations.  Assessment/Plan:   Active Problems:   Chest pain  Exertional chest pain:  - Complete cycle of cardiac enzymes -stress test abnormal -plan for cath on 8/29  STI exposure and dysuria:  - Empiric Tx w/CTX, azithro, flagyl x1. Will send for GC/Chl -HIV negative  Pruritus: Most attributable to bed bugs. Counseling provided. Pt has had extermination services.  - Symptomatic Tx    Family Communication/Anticipated D/C date and plan/Code Status    Code Status: Full Code.  Family Communication:  Disposition Plan: pending cath planned for 8/29   Medical Consultants:    cards  Subjective:   Lots of questions about cath-- nursing to provide written information  Objective:    Vitals:   02/21/18 1645 02/21/18 1926 02/22/18 0025 02/22/18 0354  BP: (!) 142/84 138/76 103/62 110/76  Pulse: 60 68 60 (!) 55  Resp: 20 20 18 18   Temp:  97.8 F (36.6 C) 98.1 F (36.7 C) 98.2 F (36.8 C)  TempSrc:  Oral Oral Oral   SpO2: 100% 97% 96% 98%  Weight:    71.3 kg  Height:        Intake/Output Summary (Last 24 hours) at 02/22/2018 1003 Last data filed at 02/22/2018 [id number] Gross per 24 hour  Intake 770 ml  Output 975 ml  Net -205 ml   Filed Weights   02/21/18 0003 02/21/18 0500 02/22/18 0354  Weight: 73.6 kg 73.8 kg 71.3 kg    Exam: In chair, eating breakfast NAD Spoke via sign language interpreter rrr No increased work of breathing  Data Reviewed:   I have personally reviewed following labs and imaging studies:  Labs: Labs show the following:   Basic Metabolic Panel: Recent Labs  Lab 02/21/18 0025  NA 140  K 4.2  CL 106  CO2 25  GLUCOSE 103*  BUN 19  CREATININE 1.21  CALCIUM 9.2   GFR Estimated Creatinine Clearance: 59.7 mL/min (by C-G formula based on SCr of 1.21 mg/dL). Liver Function Tests: No results for input(s): AST, ALT, ALKPHOS, BILITOT, PROT, ALBUMIN in the last 168 hours. No results for input(s): LIPASE, AMYLASE in the last 168 hours. No results for input(s): AMMONIA in the last 168 hours. Coagulation profile No results for input(s): INR, PROTIME in the last 168 hours.  CBC: Recent Labs  Lab 02/21/18 0025  WBC 4.4  NEUTROABS 2.6  HGB 15.0  HCT 45.6  MCV 96.8  PLT 195   Cardiac Enzymes: Recent Labs  Lab 02/21/18 0408 02/21/18 [id number] 02/21/18 0957  TROPONINI <0.03 <0.03 <0.03   BNP (last 3 results) No results for input(s): PROBNP in the last 8760 hours. CBG: No results for input(s): GLUCAP in the last 168 hours. D-Dimer: Recent Labs    02/21/18 0957  DDIMER 0.35   Hgb A1c: No results for input(s): HGBA1C in the last 72 hours. Lipid Profile: Recent Labs    02/21/18 0408  CHOL 162  HDL 36*  LDLCALC 107*  TRIG 97  CHOLHDL 4.5   Thyroid function studies: No results for input(s): TSH, T4TOTAL, T3FREE, THYROIDAB in the last 72 hours.  Invalid input(s): FREET3 Anemia work up: No results for input(s): VITAMINB12, FOLATE, FERRITIN, TIBC,  IRON, RETICCTPCT in the last 72 hours. Sepsis Labs: Recent Labs  Lab 02/21/18 0025  WBC 4.4    Microbiology No results found for this or any previous visit (from the past 240 hour(s)).  Procedures and diagnostic studies:  Dg Chest 2 View  Result Date: 02/21/2018 CLINICAL DATA:  67 y/o M; centralized chest pain radiating to the left shoulder. EXAM: CHEST - 2 VIEW COMPARISON:  None. FINDINGS: The heart size and mediastinal contours are within normal limits. Both lungs are clear. The visualized skeletal structures are unremarkable. IMPRESSION: No active cardiopulmonary disease. Electronically Signed   By: Mitzi Hansen M.D.   On: 02/21/2018 00:25   Nm Myocar Multi W/spect W/wall Motion / Ef  Result Date: 02/21/2018  There was no ST segment deviation noted during stress.  No T wave inversion was noted during stress.  Defect 1: There is a medium defect of severe severity present in the basal inferolateral and mid inferolateral location.  Findings consistent with ischemia.  This is an intermediate risk study.  The left ventricular ejection fraction is mildly decreased (45-54%).  Abnormal intermediate risk stress nuclear study with inferior basal thinning and mild to moderate ischemia in the basal inferior lateral wall.  The gated ejection fraction was 48% with global hypokinesis.    Medications:   . enoxaparin (LOVENOX) injection  40 mg Subcutaneous Q24H   Continuous Infusions:   LOS: 0 days   Joseph Art  [facility]   *Please refer to [email or web address], password [email or web address] to get updated schedule on who will round on this patient, as hospitalists switch teams weekly. If 7PM-7AM, please contact night-coverage at [email or web address], password [email or web address] for any overnight needs.  02/22/2018, 10:03 AM

## 2018-02-22 NOTE — Progress Notes (Signed)
Progress Note  Patient Name: Bobby Griffith Date of Encounter: 02/22/2018  Primary Cardiologist: No primary care provider on file.   Subjective   Feels good today. No more CP or dyspnea.  Inpatient Medications    Scheduled Meds: . aspirin EC  81 mg Oral Daily  . atorvastatin  40 mg Oral q1800  . enoxaparin (LOVENOX) injection  40 mg Subcutaneous Q24H  . isosorbide mononitrate  30 mg Oral Daily   Continuous Infusions:  PRN Meds: acetaminophen, gi cocktail, methocarbamol, morphine injection, nitroGLYCERIN, ondansetron (ZOFRAN) IV   Vital Signs    Vitals:   02/22/18 0025 02/22/18 0354 02/22/18 0930 02/22/18 1232  BP: 103/62 110/76 122/83 (!) 110/58  Pulse: 60 (!) 55 (!) 59 61  Resp: 18 18 18 18   Temp: 98.1 F (36.7 C) 98.2 F (36.8 C) 98.3 F (36.8 C) 97.6 F (36.4 C)  TempSrc: Oral Oral Oral Oral  SpO2: 96% 98% 99% 99%  Weight:  71.3 kg    Height:        Intake/Output Summary (Last 24 hours) at 02/22/2018 1458 Last data filed at 02/22/2018 1449 Gross per 24 hour  Intake 1410 ml  Output 975 ml  Net 435 ml   Filed Weights   02/21/18 0003 02/21/18 0500 02/22/18 0354  Weight: 73.6 kg 73.8 kg 71.3 kg    Telemetry    NSR - Personally Reviewed  ECG    n/a - Personally Reviewed  Physical Exam   GEN: No acute distress.   Neck: No JVD Cardiac: RRR, no murmurs, rubs, or gallops.  Respiratory: Clear to auscultation bilaterally. MS: No edema; No deformity. Neuro:  Nonfocal - deaf - using sign language interpreter Psych: Normal affect   Labs    Chemistry Recent Labs  Lab 02/21/18 0025  NA 140  K 4.2  CL 106  CO2 25  GLUCOSE 103*  BUN 19  CREATININE 1.21  CALCIUM 9.2  GFRNONAA >60  GFRAA >60  ANIONGAP 9     Hematology Recent Labs  Lab 02/21/18 0025  WBC 4.4  RBC 4.71  HGB 15.0  HCT 45.6  MCV 96.8  MCH 31.8  MCHC 32.9  RDW 13.6  PLT 195    Cardiac Enzymes Recent Labs  Lab 02/21/18 0408 02/21/18 0638 02/21/18 0957  TROPONINI  <0.03 <0.03 <0.03    Recent Labs  Lab 02/21/18 0029  TROPIPOC 0.03     BNPNo results for input(s): BNP, PROBNP in the last 168 hours.   DDimer  Recent Labs  Lab 02/21/18 0957  DDIMER 0.35     Radiology    Dg Chest 2 View  Result Date: 02/21/2018 CLINICAL DATA:  67 y/o M; centralized chest pain radiating to the left shoulder. EXAM: CHEST - 2 VIEW COMPARISON:  None. FINDINGS: The heart size and mediastinal contours are within normal limits. Both lungs are clear. The visualized skeletal structures are unremarkable. IMPRESSION: No active cardiopulmonary disease. Electronically Signed   By: Mitzi HansenLance  Furusawa-Stratton M.D.   On: 02/21/2018 00:25   Nm Myocar Multi W/spect W/wall Motion / Ef  Result Date: 02/21/2018  There was no ST segment deviation noted during stress.  No T wave inversion was noted during stress.  Defect 1: There is a medium defect of severe severity present in the basal inferolateral and mid inferolateral location.  Findings consistent with ischemia.  This is an intermediate risk study.  The left ventricular ejection fraction is mildly decreased (45-54%).  Abnormal intermediate risk stress nuclear study with  inferior basal thinning and mild to moderate ischemia in the basal inferior lateral wall.  The gated ejection fraction was 48% with global hypokinesis.    Cardiac Studies   TTE 02/22/18: EF 55-60%. No RWMA. Gr 1 DD - otw normal.  Myoview 8/27:  Personally Reviewed (agree with findings)  There was no ST segment deviation noted during stress.  No T wave inversion was noted during stress.  Defect 1: There is a medium defect of severe severity present in the basal inferolateral and mid inferolateral location.  Findings consistent with ischemia.  This is an intermediate risk study.  The left ventricular ejection fraction is mildly decreased (45-54%).   Abnormal intermediate risk stress nuclear study with inferior basal thinning and mild to moderate  ischemia in the basal inferior lateral wall.  The gated ejection fraction was 48% with global hypokinesis  Patient Profile     67 y.o. 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 Jarvis Newcomer / Dr. Benjamine Mola.  Initial evaluation with ST read as Abnormal with Intermediate Risk - sizeable inferior perfusion defect concerning for possible infarct with peri-infarct ischemia vs. Resting ischemia.  Assessment & Plan    Chest Pain on exertion - concerning for Class II-III Angina --> Abnormal Myoview would suggest Cx-OM vs. RCA-PL disease.  Plan Cath Tomorrow (unable to schedule for today). NPO after MN - will do orders.  Added Imdur, ASA & Statin  With resting HR in 50-60 range - will hold off on Beta Blocker, but consider ACE-I/ARB post Cath.   Performing MD:  [facility].  Procedure: LEFT HEART CATHETERIZATION WITH CORONARY ANGIOGRAPHY AND POSSIBLE PERTAINS CORONARY INTERVENTION  The procedure with Risks/Benefits/Alternatives and Indications was reviewed with the patient --using contracted sign language interpreter.  All questions were answered.    Risks / Complications include, but not limited to: Death, MI, CVA/TIA, VF/VT (with defibrillation), Bradycardia (need for temporary pacer placement), contrast induced nephropathy, bleeding / bruising / hematoma / pseudoaneurysm, vascular or coronary injury (with possible emergent CT or Vascular Surgery), adverse medication reactions, infection.  Additional risks involving the use of radiation with the possibility of radiation burns and cancer were explained in detail.  The patient indicates using sign language his understanding and agrees to proceed.        For questions or updates, please contact [facility] Please consult [email or web address] for contact info under Cardiology/STEMI.      Signed, Bryan Lemma, MD  02/22/2018, 2:58 PM

## 2018-02-22 NOTE — Progress Notes (Signed)
Per cards note, cath for Thursday, pt is NPO as Dr Jarvis NewcomerGrunz was unsure of plan. Paged Dr Benjamine MolaVann to advise of diet order.

## 2018-02-22 NOTE — Progress Notes (Signed)
To the best of my knowledge, documentation by I Burke, [employer] nursing student is correct. 

## 2018-02-22 NOTE — Progress Notes (Signed)
  Echocardiogram 2D Echocardiogram has been performed.  Bobby PartridgeBrooke S [name] 02/22/2018, 11:36 AM

## 2018-02-22 NOTE — Progress Notes (Signed)
Page sent to Dr Benjamine MolaVann and Geoffry ParadiseLindsey Roberts, PA with Cardiology to notify of ASL interpreter availability. Mardella LaymanLindsey states she will text Dr Herbie BaltimoreHarding to notify of her availability at bedside.

## 2018-02-22 NOTE — H&P (View-Only) (Signed)
 Progress Note  Patient Name: Bobby Griffith Date of Encounter: 02/22/2018  Primary Cardiologist: No primary care provider on file.   Subjective   Feels good today. No more CP or dyspnea.  Inpatient Medications    Scheduled Meds: . aspirin EC  81 mg Oral Daily  . atorvastatin  40 mg Oral q1800  . enoxaparin (LOVENOX) injection  40 mg Subcutaneous Q24H  . isosorbide mononitrate  30 mg Oral Daily   Continuous Infusions:  PRN Meds: acetaminophen, gi cocktail, methocarbamol, morphine injection, nitroGLYCERIN, ondansetron (ZOFRAN) IV   Vital Signs    Vitals:   02/22/18 0025 02/22/18 0354 02/22/18 0930 02/22/18 1232  BP: 103/62 110/76 122/83 (!) 110/58  Pulse: 60 (!) 55 (!) 59 61  Resp: 18 18 18 18  Temp: 98.1 F (36.7 C) 98.2 F (36.8 C) 98.3 F (36.8 C) 97.6 F (36.4 C)  TempSrc: Oral Oral Oral Oral  SpO2: 96% 98% 99% 99%  Weight:  71.3 kg    Height:        Intake/Output Summary (Last 24 hours) at 02/22/2018 1458 Last data filed at 02/22/2018 1449 Gross per 24 hour  Intake 1410 ml  Output 975 ml  Net 435 ml   Filed Weights   02/21/18 0003 02/21/18 0500 02/22/18 0354  Weight: 73.6 kg 73.8 kg 71.3 kg    Telemetry    NSR - Personally Reviewed  ECG    n/a - Personally Reviewed  Physical Exam   GEN: No acute distress.   Neck: No JVD Cardiac: RRR, no murmurs, rubs, or gallops.  Respiratory: Clear to auscultation bilaterally. MS: No edema; No deformity. Neuro:  Nonfocal - deaf - using sign language interpreter Psych: Normal affect   Labs    Chemistry Recent Labs  Lab 02/21/18 0025  NA 140  K 4.2  CL 106  CO2 25  GLUCOSE 103*  BUN 19  CREATININE 1.21  CALCIUM 9.2  GFRNONAA >60  GFRAA >60  ANIONGAP 9     Hematology Recent Labs  Lab 02/21/18 0025  WBC 4.4  RBC 4.71  HGB 15.0  HCT 45.6  MCV 96.8  MCH 31.8  MCHC 32.9  RDW 13.6  PLT 195    Cardiac Enzymes Recent Labs  Lab 02/21/18 0408 02/21/18 0638 02/21/18 0957  TROPONINI  <0.03 <0.03 <0.03    Recent Labs  Lab 02/21/18 0029  TROPIPOC 0.03     BNPNo results for input(s): BNP, PROBNP in the last 168 hours.   DDimer  Recent Labs  Lab 02/21/18 0957  DDIMER 0.35     Radiology    Dg Chest 2 View  Result Date: 02/21/2018 CLINICAL DATA:  67 y/o M; centralized chest pain radiating to the left shoulder. EXAM: CHEST - 2 VIEW COMPARISON:  None. FINDINGS: The heart size and mediastinal contours are within normal limits. Both lungs are clear. The visualized skeletal structures are unremarkable. IMPRESSION: No active cardiopulmonary disease. Electronically Signed   By: Lance  Furusawa-Stratton M.D.   On: 02/21/2018 00:25   Nm Myocar Multi W/spect W/wall Motion / Ef  Result Date: 02/21/2018  There was no ST segment deviation noted during stress.  No T wave inversion was noted during stress.  Defect 1: There is a medium defect of severe severity present in the basal inferolateral and mid inferolateral location.  Findings consistent with ischemia.  This is an intermediate risk study.  The left ventricular ejection fraction is mildly decreased (45-54%).  Abnormal intermediate risk stress nuclear study with   inferior basal thinning and mild to moderate ischemia in the basal inferior lateral wall.  The gated ejection fraction was 48% with global hypokinesis.    Cardiac Studies   TTE 02/22/18: EF 55-60%. No RWMA. Gr 1 DD - otw normal.  Myoview 8/27:  Personally Reviewed (agree with findings)  There was no ST segment deviation noted during stress.  No T wave inversion was noted during stress.  Defect 1: There is a medium defect of severe severity present in the basal inferolateral and mid inferolateral location.  Findings consistent with ischemia.  This is an intermediate risk study.  The left ventricular ejection fraction is mildly decreased (45-54%).   Abnormal intermediate risk stress nuclear study with inferior basal thinning and mild to moderate  ischemia in the basal inferior lateral wall.  The gated ejection fraction was 48% with global hypokinesis  Patient Profile     67 y.o. 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 Grunz / Dr. Vann.  Initial evaluation with ST read as Abnormal with Intermediate Risk - sizeable inferior perfusion defect concerning for possible infarct with peri-infarct ischemia vs. Resting ischemia.  Assessment & Plan    Chest Pain on exertion - concerning for Class II-III Angina --> Abnormal Myoview would suggest Cx-OM vs. RCA-PL disease.  Plan Cath Tomorrow (unable to schedule for today). NPO after MN - will do orders.  Added Imdur, ASA & Statin  With resting HR in 50-60 range - will hold off on Beta Blocker, but consider ACE-I/ARB post Cath.   Performing MD:  [facility].  Procedure: LEFT HEART CATHETERIZATION WITH CORONARY ANGIOGRAPHY AND POSSIBLE PERTAINS CORONARY INTERVENTION  The procedure with Risks/Benefits/Alternatives and Indications was reviewed with the patient --using contracted sign language interpreter.  All questions were answered.    Risks / Complications include, but not limited to: Death, MI, CVA/TIA, VF/VT (with defibrillation), Bradycardia (need for temporary pacer placement), contrast induced nephropathy, bleeding / bruising / hematoma / pseudoaneurysm, vascular or coronary injury (with possible emergent CT or Vascular Surgery), adverse medication reactions, infection.  Additional risks involving the use of radiation with the possibility of radiation burns and cancer were explained in detail.  The patient indicates using sign language his understanding and agrees to proceed.        For questions or updates, please contact [facility] Please consult [email or web address] for contact info under Cardiology/STEMI.      Signed, Braxden Lovering, MD  02/22/2018, 2:58 PM    

## 2018-02-23 ENCOUNTER — Encounter (HOSPITAL_COMMUNITY)
Admission: EM | Disposition: A | Discharge status: Home / Self Care | Payer: Self-pay | Source: Home / Self Care | Attending: Emergency Medicine

## 2018-02-23 DIAGNOSIS — I25119 Atherosclerotic heart disease of native coronary artery with unspecified angina pectoris: Secondary | ICD-10-CM

## 2018-02-23 DIAGNOSIS — I25118 Atherosclerotic heart disease of native coronary artery with other forms of angina pectoris: Secondary | ICD-10-CM

## 2018-02-23 DIAGNOSIS — R079 Chest pain, unspecified: Secondary | ICD-10-CM | POA: Diagnosis not present

## 2018-02-23 HISTORY — PX: LEFT HEART CATH AND CORONARY ANGIOGRAPHY: CATH118249

## 2018-02-23 LAB — CBC
HEMATOCRIT: 43.7 % (ref 39.0–52.0)
HEMOGLOBIN: 14.3 g/dL (ref 13.0–17.0)
MCH: 31.6 pg (ref 26.0–34.0)
MCHC: 32.7 g/dL (ref 30.0–36.0)
MCV: 96.7 fL (ref 78.0–100.0)
PLATELETS: 217 10*3/uL (ref 150–400)
RBC: 4.52 MIL/uL (ref 4.22–5.81)
RDW: 14 % (ref 11.5–15.5)
WBC: 4.3 10*3/uL (ref 4.0–10.5)

## 2018-02-23 LAB — BASIC METABOLIC PANEL
ANION GAP: 11 (ref 5–15)
BUN: 21 mg/dL (ref 8–23)
CHLORIDE: 106 mmol/L (ref 98–111)
CO2: 20 mmol/L — AB (ref 22–32)
CREATININE: 1.16 mg/dL (ref 0.61–1.24)
Calcium: 8.9 mg/dL (ref 8.9–10.3)
GFR calc non Af Amer: >60 mL/min (ref >60)
Glucose, Bld: 76 mg/dL (ref 70–99)
POTASSIUM: 4.7 mmol/L (ref 3.5–5.1)
Sodium: 137 mmol/L (ref 135–145)

## 2018-02-23 LAB — PROTIME-INR
INR: 1.02
Prothrombin Time: 13.3 seconds (ref 11.4–15.2)

## 2018-02-23 SURGERY — LEFT HEART CATH AND CORONARY ANGIOGRAPHY
Anesthesia: LOCAL

## 2018-02-23 MED ORDER — SODIUM CHLORIDE 0.9% FLUSH: 3.0000 mL | INTRAVENOUS | Status: DC | PRN

## 2018-02-23 MED ORDER — SODIUM CHLORIDE 0.9% FLUSH: 3.0000 mL | Freq: Two times a day (BID) | INTRAVENOUS | Status: DC

## 2018-02-23 MED ORDER — METOPROLOL TARTRATE 12.5 MG HALF TABLET
12.5000 mg | ORAL_TABLET | Freq: Two times a day (BID) | ORAL | Status: DC
  Administered 2018-02-23: 12.5 mg via ORAL
  Filled 2018-02-23: qty 1

## 2018-02-23 MED ORDER — HEPARIN SODIUM (PORCINE) 1000 UNIT/ML IJ SOLN
INTRAMUSCULAR | Status: DC | PRN
  Administered 2018-02-23: 4000 [IU] via INTRAVENOUS

## 2018-02-23 MED ORDER — ASPIRIN 81 MG PO TBEC: 81.0000 mg | DELAYED_RELEASE_TABLET | Freq: Every day | ORAL | 0 refills | Status: DC

## 2018-02-23 MED ORDER — FENTANYL CITRATE (PF) 100 MCG/2ML IJ SOLN
INTRAMUSCULAR | Status: AC
  Filled 2018-02-23: qty 2

## 2018-02-23 MED ORDER — HEPARIN SODIUM (PORCINE) 1000 UNIT/ML IJ SOLN
INTRAMUSCULAR | Status: AC
  Filled 2018-02-23: qty 1

## 2018-02-23 MED ORDER — SODIUM CHLORIDE 0.9 % IV SOLN
INTRAVENOUS | Status: DC
  Administered 2018-02-23: 75 mL/h via INTRAVENOUS

## 2018-02-23 MED ORDER — METOPROLOL TARTRATE 25 MG PO TABS: 12.5000 mg | ORAL_TABLET | Freq: Two times a day (BID) | ORAL | 0 refills | Status: DC

## 2018-02-23 MED ORDER — MIDAZOLAM HCL 2 MG/2ML IJ SOLN
INTRAMUSCULAR | Status: AC
  Filled 2018-02-23: qty 2

## 2018-02-23 MED ORDER — HEPARIN (PORCINE) IN NACL 1000-0.9 UT/500ML-% IV SOLN
INTRAVENOUS | Status: AC
  Filled 2018-02-23: qty 1000

## 2018-02-23 MED ORDER — LIDOCAINE HCL (PF) 1 % IJ SOLN
INTRAMUSCULAR | Status: AC
  Filled 2018-02-23: qty 30

## 2018-02-23 MED ORDER — CAMPHOR-MENTHOL 0.5-0.5 % EX LOTN
TOPICAL_LOTION | CUTANEOUS | Status: DC | PRN
  Filled 2018-02-23: qty 222

## 2018-02-23 MED ORDER — DIPHENHYDRAMINE HCL 50 MG/ML IJ SOLN: 12.5000 mg | Freq: Three times a day (TID) | INTRAMUSCULAR | Status: DC | PRN

## 2018-02-23 MED ORDER — ASPIRIN 81 MG PO CHEW
81.0000 mg | CHEWABLE_TABLET | ORAL | Status: AC
  Administered 2018-02-23: 81 mg via ORAL
  Filled 2018-02-23: qty 1

## 2018-02-23 MED ORDER — ATORVASTATIN CALCIUM 40 MG PO TABS: 40.0000 mg | ORAL_TABLET | Freq: Every day | ORAL | 0 refills | Status: DC

## 2018-02-23 MED ORDER — HEPARIN (PORCINE) IN NACL 1000-0.9 UT/500ML-% IV SOLN
INTRAVENOUS | Status: DC | PRN
  Administered 2018-02-23 (×2): 500 mL

## 2018-02-23 MED ORDER — LIDOCAINE HCL (PF) 1 % IJ SOLN
INTRAMUSCULAR | Status: DC | PRN
  Administered 2018-02-23: 2 mL via INTRADERMAL

## 2018-02-23 MED ORDER — MIDAZOLAM HCL 2 MG/2ML IJ SOLN
INTRAMUSCULAR | Status: DC | PRN
  Administered 2018-02-23: 2 mg via INTRAVENOUS

## 2018-02-23 MED ORDER — SODIUM CHLORIDE 0.9 % IV SOLN: 250.0000 mL | INTRAVENOUS | Status: DC | PRN

## 2018-02-23 MED ORDER — NITROGLYCERIN 0.4 MG SL SUBL: 0.4000 mg | SUBLINGUAL_TABLET | SUBLINGUAL | 0 refills | Status: DC | PRN

## 2018-02-23 MED ORDER — FENTANYL CITRATE (PF) 100 MCG/2ML IJ SOLN
INTRAMUSCULAR | Status: DC | PRN
  Administered 2018-02-23: 50 ug via INTRAVENOUS

## 2018-02-23 MED ORDER — ENOXAPARIN SODIUM 40 MG/0.4ML ~~LOC~~ SOLN: 40.0000 mg | SUBCUTANEOUS | Status: DC

## 2018-02-23 MED ORDER — METOPROLOL SUCCINATE ER 25 MG PO TB24: 12.5000 mg | ORAL_TABLET | Freq: Every day | ORAL | Status: DC

## 2018-02-23 MED ORDER — VERAPAMIL HCL 2.5 MG/ML IV SOLN
INTRAVENOUS | Status: DC | PRN
  Administered 2018-02-23: 10 mL via INTRA_ARTERIAL

## 2018-02-23 MED ORDER — IOPAMIDOL (ISOVUE-370) INJECTION 76%
INTRAVENOUS | Status: AC
  Filled 2018-02-23: qty 100

## 2018-02-23 MED ORDER — IOPAMIDOL (ISOVUE-370) INJECTION 76%
INTRAVENOUS | Status: DC | PRN
  Administered 2018-02-23: 50 mL

## 2018-02-23 MED ORDER — VERAPAMIL HCL 2.5 MG/ML IV SOLN
INTRAVENOUS | Status: AC
  Filled 2018-02-23: qty 2

## 2018-02-23 MED ORDER — CAMPHOR-MENTHOL 0.5-0.5 % EX LOTN: TOPICAL_LOTION | CUTANEOUS | 0 refills | Status: DC | PRN

## 2018-02-23 SURGICAL SUPPLY — 11 items
CATH INFINITI 5 FR JL3.5 (CATHETERS) ×2 IMPLANT
CATH INFINITI 5FR ANG PIGTAIL (CATHETERS) ×2 IMPLANT
CATH INFINITI JR4 5F (CATHETERS) ×2 IMPLANT
DEVICE RAD COMP TR BAND LRG (VASCULAR PRODUCTS) ×2 IMPLANT
GLIDESHEATH SLEND SS 6F .021 (SHEATH) ×2 IMPLANT
GUIDEWIRE INQWIRE 1.5J.035X260 (WIRE) ×1 IMPLANT
INQWIRE 1.5J .035X260CM (WIRE) ×2
KIT HEART LEFT (KITS) ×2 IMPLANT
PACK CARDIAC CATHETERIZATION (CUSTOM PROCEDURE TRAY) ×2 IMPLANT
TRANSDUCER W/STOPCOCK (MISCELLANEOUS) ×2 IMPLANT
TUBING CIL FLEX 10 FLL-RA (TUBING) ×2 IMPLANT

## 2018-02-23 NOTE — Interval H&P Note (Signed)
History and Physical Interval Note:  02/23/2018 1:36 PM  Bobby Griffith  has presented today for cardiac cath with the diagnosis of chest pain/abnormal stress  The various methods of treatment have been discussed with the patient and family. After consideration of risks, benefits and other options for treatment, the patient has consented to  Procedure(s): LEFT HEART CATH AND CORONARY ANGIOGRAPHY (N/A) as a surgical intervention .  The patient's history has been reviewed, patient examined, no change in status, stable for surgery.  I have reviewed the patient's chart and labs.  Questions were answered to the patient's satisfaction.    Cath Lab Visit (complete for each Cath Lab visit)  Clinical Evaluation Leading to the Procedure:   ACS: No.  Non-ACS:    Anginal Classification: CCS III  Anti-ischemic medical therapy: No Therapy  Non-Invasive Test Results: Intermediate-risk stress test findings: cardiac mortality 1-3%/year  Prior CABG: No previous CABG         Verne Carrowhristopher Jerrald Doverspike

## 2018-02-23 NOTE — Progress Notes (Signed)
Patient transported to cath lab with sign language interpreter going.

## 2018-02-23 NOTE — Progress Notes (Signed)
Reviewed all discharge instructions with patient via sign language interpreter.  Personal belonging of cell phone and charger, clothing, wallet, hat, and $ that had been locked in safe and counted with patient via  To toal $571.30.  Patient stated understanding of all instructions.  His transportation home to arrive around 8pm.  He had no voiced complaints.

## 2018-02-23 NOTE — Discharge Instructions (Signed)
PLEASE REMEMBER TO BRING ALL OF YOUR MEDICATIONS TO EACH OF YOUR FOLLOW-UP OFFICE VISITS.  PLEASE ATTEND ALL SCHEDULED FOLLOW-UP APPOINTMENTS.   Activity: Increase activity slowly as tolerated. You may shower, but no soaking baths (or swimming) for 1 week. No driving for 24 hours. No lifting over 5 lbs for 1 week. No sexual activity for 1 week.   You May Return to Work: in 1 week (if applicable)  Wound Care: You may wash cath site gently with soap and water. Keep cath site clean and dry. If you notice pain, swelling, bleeding or pus at your cath site, please call [phone].   Do not combine nitrates and viagra as this can dangerously lower your blood pressure

## 2018-02-23 NOTE — Progress Notes (Signed)
Dr. Benjamine MolaVann at bedside and sign language interpreter at bedside. Patient requested his brother, Bobby Griffith be listed as 1st contact, then his cousin Bobby Griffith as 2nd contact.  He requested his brother be called after heart catheterization with procedure results and that he be called via his cell phone.  He called his brother, Bobby Griffith, and Danny's cell phone number added to contact list as 1st contact.

## 2018-02-23 NOTE — Progress Notes (Signed)
Patient asked via written note if he could have an interpreter to sign for him this morning prior to scheduled 12:30 time.  Interpreter services notified and they stated they will have person that uses sign language come to room.

## 2018-02-23 NOTE — Progress Notes (Signed)
   Patient just had cardiac catheterization.  Results reviewed.  Shows CTO of what appears to be old stents in the RCA.  This would explain the abnormal finding on nuclear stress test which is essentially a fixed inferior and inferolateral defect.  This goes along with this anatomy.  I do not think that his current episode was a cardiac event, and I think the nuclear stress test just happened to show us something that was not previously understood.  He does have coronary artery disease and therefore needs to be on optimize medical management, I think the best course of action will be to DC Imdur and start low-dose beta-blocker  in addition to the aspirin and statin.  Would also then prescribe as needed nitroglycerin for chest pain.  Would probably consider GI etiology for his current chest discomfort.  [facility] will sign off.   Medication Recommendations: Discontinue Imdur, start (Lopressor 12.5 mg twice daily) Other recommendations (labs, testing, etc): No further testing necessary besides perhaps a fasting lipid panel prior to outpatient follow-up Follow up as an outpatient: He will be arranged to see Dr. Herbie BaltimoreHarding or APP in follow-up.  Information will be placed in discharge instructions.     Bryan Lemmaavid Georgia Baria, M.D., M.S. Interventional Cardiologist   Pager # [phone] Phone # [phone] [address] [place], [place] [address]

## 2018-02-23 NOTE — Discharge Summary (Signed)
Physician Discharge Summary  Bobby Griffith ZOX:[id number] DOB: 12/22/1950 DOA: 02/20/2018  PCP: Patient, No Pcp Per  Admit date: 02/20/2018 Discharge date: 02/23/2018  Admitted From: home Discharge disposition: home   Recommendations for Outpatient Follow-Up:   1. Cardiology follow up post cath 2. Check LFTs/FLP in 6 weeks 3. Pending GC/Chlaymdia probe 4. Outpatient GI referral   Discharge Diagnosis:   Active Problems:   Chest pain    Discharge Condition: Improved.  Diet recommendation: Low sodium, heart healthy.  Wound care: None.  Code status: Full.   History of Present Illness:   Bobby Griffith is a 67 y.o. male with medical history significant of deafness; who presents with complaints of chest pain.  Patient reports symptoms have been present  Intermittently over the last 1 month.  He describes it as a sharp pain in the left chest radiating to his shoulder.  Associated symptoms include some diaphoresis, but denies any shortness of breath or nausea symptoms.  Symptoms worsen with exertion and relieved with resting.  He had previously tried Tylenol without relief of symptoms.  En route with EMS patient was given 325 mg ofaspirin and nitroglycerin with some relief of pain symptoms.  He denies any alcohol, tobacco, or illicit drug use.  He denies any significant family history of heart disease, prolonged immobilization, calf pain, leg swelling, or palpitations.  ED Course: Admission into the emergency department patient was noted to be afebrile, pulse 55-23, respiration 14-25, and all other vitals maintained.  Labs were unremarkable including CBC, BMP, and initial troponin.  EKG was unremarkable.  TR[facility] called to admit for chest pain rule out.   Hospital Course by Problem:   Exertional chest pain:  - Complete cycle of cardiac enzymes -stress test abnormal -s/p cath: medical management- on statin, bb, asa  STI exposure and dysuria:  - Empiric Tx w/CTX, azithro,  flagyl x1 -continue to await GC/Chl -HIV negative  Pruritus: Most attributable to bed bugs. Counseling provided. Pt has had extermination services.  - sarna and benadryl PRN     Medical Consultants:   cardiology   Discharge Exam:   Vitals:   02/23/18 1529 02/23/18 1538  BP: 114/72 108/72  Pulse: 75 79  Resp: 16 20  Temp:    SpO2: 99% 98%   Vitals:   02/23/18 1446 02/23/18 1504 02/23/18 1529 02/23/18 1538  BP: 115/76 120/79 114/72 108/72  Pulse: 66 69 75 79  Resp: 18 16 16 20   Temp:      TempSrc:      SpO2: 95% 100% 99% 98%  Weight:      Height:          The results of significant diagnostics from this hospitalization (including imaging, microbiology, ancillary and laboratory) are listed below for reference.     Procedures and Diagnostic Studies:   Dg Chest 2 View  Result Date: 02/21/2018 CLINICAL DATA:  67 y/o M; centralized chest pain radiating to the left shoulder. EXAM: CHEST - 2 VIEW COMPARISON:  None. FINDINGS: The heart size and mediastinal contours are within normal limits. Both lungs are clear. The visualized skeletal structures are unremarkable. IMPRESSION: No active cardiopulmonary disease. Electronically Signed   By: Mitzi Hansen M.D.   On: 02/21/2018 00:25   Nm Myocar Multi W/spect W/wall Motion / Ef  Result Date: 02/21/2018  There was no ST segment deviation noted during stress.  No T wave inversion was noted during stress.  Defect 1: There is a medium defect of  severe severity present in the basal inferolateral and mid inferolateral location.  Findings consistent with ischemia.  This is an intermediate risk study.  The left ventricular ejection fraction is mildly decreased (45-54%).  Abnormal intermediate risk stress nuclear study with inferior basal thinning and mild to moderate ischemia in the basal inferior lateral wall.  The gated ejection fraction was 48% with global hypokinesis.     Labs:   Basic Metabolic Panel: Recent  Labs  Lab 02/21/18 0025 02/23/18 0326  NA 140 137  K 4.2 4.7  CL 106 106  CO2 25 20*  GLUCOSE 103* 76  BUN 19 21  CREATININE 1.21 1.16  CALCIUM 9.2 8.9   GFR Estimated Creatinine Clearance: 63.2 mL/min (by C-G formula based on SCr of 1.16 mg/dL). Liver Function Tests: No results for input(s): AST, ALT, ALKPHOS, BILITOT, PROT, ALBUMIN in the last 168 hours. No results for input(s): LIPASE, AMYLASE in the last 168 hours. No results for input(s): AMMONIA in the last 168 hours. Coagulation profile Recent Labs  Lab 02/23/18 0634  INR 1.02    CBC: Recent Labs  Lab 02/21/18 0025 02/23/18 0326  WBC 4.4 4.3  NEUTROABS 2.6  --   HGB 15.0 14.3  HCT 45.6 43.7  MCV 96.8 96.7  PLT 195 217   Cardiac Enzymes: Recent Labs  Lab 02/21/18 0408 02/21/18 0638 02/21/18 0957  TROPONINI <0.03 <0.03 <0.03   BNP: Invalid input(s): POCBNP CBG: No results for input(s): GLUCAP in the last 168 hours. D-Dimer Recent Labs    02/21/18 0957  DDIMER 0.35   Hgb A1c No results for input(s): HGBA1C in the last 72 hours. Lipid Profile Recent Labs    02/21/18 0408  CHOL 162  HDL 36*  LDLCALC 107*  TRIG 97  CHOLHDL 4.5   Thyroid function studies No results for input(s): TSH, T4TOTAL, T3FREE, THYROIDAB in the last 72 hours.  Invalid input(s): FREET3 Anemia work up No results for input(s): VITAMINB12, FOLATE, FERRITIN, TIBC, IRON, RETICCTPCT in the last 72 hours. Microbiology No results found for this or any previous visit (from the past 240 hour(s)).   Discharge Instructions:   Discharge Instructions    Diet - low sodium heart healthy   Complete by:  As directed    Discharge instructions   Complete by:  As directed    Cardiology recommends managing your heart disease with medications.  You will need to follow up closely with your PCP as well as your cardiologist   Increase activity slowly   Complete by:  As directed      Allergies as of 02/23/2018   No Known Allergies      Medication List    TAKE these medications   aspirin 81 MG EC tablet Take 1 tablet (81 mg total) by mouth daily. Start taking on:  02/24/2018   atorvastatin 40 MG tablet Commonly known as:  LIPITOR Take 1 tablet (40 mg total) by mouth daily at 6 PM.   camphor-menthol lotion Commonly known as:  SARNA Apply topically as needed for itching.   metoprolol tartrate 25 MG tablet Commonly known as:  LOPRESSOR Take 0.5 tablets (12.5 mg total) by mouth 2 (two) times daily.   nitroGLYCERIN 0.4 MG SL tablet Commonly known as:  NITROSTAT Place 1 tablet (0.4 mg total) under the tongue every 5 (five) minutes as needed for chest pain (CP or SOB).      Follow-up Information    Jodelle Gross, NP Follow up on 03/09/2018.   Specialties:  Nurse Practitioner, Radiology, Cardiology Why:  Please arrive 15 minutes early for your 9:30am post-hospital cardiology follow-up appointment Contact information: [address] [place] [address] [phone]        PCP to refer to GI for further work up Follow up.            Time coordinating discharge: 25 min  Signed:  Joseph ArtJessica U Mikalah Skyles  [facility] 02/23/2018, 3:40 PM

## 2018-02-23 NOTE — Progress Notes (Signed)
Received patient from cath lab.  Interpreter with patient and hand-off to another interpreter, Darel HongJudy.  She is at bedside.  Patient's brother Dannielle HuhDanny called and informed that patient did not require a stent and will be managed with medications.  Patient's cell phone and charger returned to patient.  Radial band on right wrist with 10 ml of air per cath lab report.

## 2018-02-23 NOTE — Progress Notes (Addendum)
Interpreter to bedside and sign language used to explain cardiac cath procedure possible outcomes and possible treatments.  Patient stated he had "Bed bugs" at home and showed healed places on abdomen where he had bed bug bites in February.  Stated he itches now and like when he did at home and he requested a medication for itching.  Will notify MD for prn medication for itching.

## 2018-02-23 NOTE — Progress Notes (Signed)
0 cc air in TR Band and at 30 minute reassess, the site remains level 0 so 2x2 gauze dressing applied and covered with tegaderm.  Will page cardiology service to assess site then if ok, patient to be discharged this evening.  Patient updated on plan of care via sign language interpreter.

## 2018-02-23 NOTE — Progress Notes (Signed)
Progress Note    Graeson Nouri  WUJ:[id number] DOB: 12/22/1950  DOA: 02/20/2018 PCP: Patient, No Pcp Per    Brief Narrative:     Medical records reviewed and are as summarized below:  Trae Bovenzi is an 67 y.o. male with medical history significant of deafness; who presents with complaints of chest pain.  Abnormal stress test and plan is for cath today.  Assessment/Plan:   Active Problems:   Chest pain  Exertional chest pain:  - Complete cycle of cardiac enzymes -stress test abnormal -plan for cath on 8/29  STI exposure and dysuria:  - Empiric Tx w/CTX, azithro, flagyl x1 -continue to await GC/Chl -HIV negative  Pruritus: Most attributable to bed bugs. Counseling provided. Pt has had extermination services.  - sarna and benadryl PRN    Family Communication/Anticipated D/C date and plan/Code Status    Code Status: Full Code.  Family Communication:  Disposition Plan: pending cath planned for 8/29   Medical Consultants:    cards  Subjective:   Asking for benadryl and viagra -will get name of PCP from his brother  Objective:    Vitals:   02/22/18 1232 02/22/18 2009 02/23/18 0451 02/23/18 1115  BP: (!) 110/58 113/64 112/89 106/75  Pulse: 61 81 83 60  Resp: 18 18 18 18   Temp: 97.6 F (36.4 C) 97.7 F (36.5 C) 98 F (36.7 C) 97.7 F (36.5 C)  TempSrc: Oral Oral Oral Oral  SpO2: 99% 97% 96% 95%  Weight:   72.3 kg   Height:        Intake/Output Summary (Last 24 hours) at 02/23/2018 1307 Last data filed at 02/23/2018 0953 Gross per 24 hour  Intake 706.83 ml  Output 850 ml  Net -143.17 ml   Filed Weights   02/21/18 0500 02/22/18 0354 02/23/18 0451  Weight: 73.8 kg 71.3 kg 72.3 kg    Exam: In bed, NAD-- seen with sign language interpreter  Data Reviewed:   I have personally reviewed following labs and imaging studies:  Labs: Labs show the following:   Basic Metabolic Panel: Recent Labs  Lab 02/21/18 0025 02/23/18 0326  NA 140  137  K 4.2 4.7  CL 106 106  CO2 25 20*  GLUCOSE 103* 76  BUN 19 21  CREATININE 1.21 1.16  CALCIUM 9.2 8.9   GFR Estimated Creatinine Clearance: 63.2 mL/min (by C-G formula based on SCr of 1.16 mg/dL). Liver Function Tests: No results for input(s): AST, ALT, ALKPHOS, BILITOT, PROT, ALBUMIN in the last 168 hours. No results for input(s): LIPASE, AMYLASE in the last 168 hours. No results for input(s): AMMONIA in the last 168 hours. Coagulation profile Recent Labs  Lab 02/23/18 0634  INR 1.02    CBC: Recent Labs  Lab 02/21/18 0025 02/23/18 0326  WBC 4.4 4.3  NEUTROABS 2.6  --   HGB 15.0 14.3  HCT 45.6 43.7  MCV 96.8 96.7  PLT 195 217   Cardiac Enzymes: Recent Labs  Lab 02/21/18 0408 02/21/18 0638 02/21/18 0957  TROPONINI <0.03 <0.03 <0.03   BNP (last 3 results) No results for input(s): PROBNP in the last 8760 hours. CBG: No results for input(s): GLUCAP in the last 168 hours. D-Dimer: Recent Labs    02/21/18 0957  DDIMER 0.35   Hgb A1c: No results for input(s): HGBA1C in the last 72 hours. Lipid Profile: Recent Labs    02/21/18 0408  CHOL 162  HDL 36*  LDLCALC 107*  TRIG 97  CHOLHDL 4.5  Thyroid function studies: No results for input(s): TSH, T4TOTAL, T3FREE, THYROIDAB in the last 72 hours.  Invalid input(s): FREET3 Anemia work up: No results for input(s): VITAMINB12, FOLATE, FERRITIN, TIBC, IRON, RETICCTPCT in the last 72 hours. Sepsis Labs: Recent Labs  Lab 02/21/18 0025 02/23/18 0326  WBC 4.4 4.3    Microbiology No results found for this or any previous visit (from the past 240 hour(s)).  Procedures and diagnostic studies:  Nm Myocar Multi W/spect W/wall Motion / Ef  Result Date: 02/21/2018  There was no ST segment deviation noted during stress.  No T wave inversion was noted during stress.  Defect 1: There is a medium defect of severe severity present in the basal inferolateral and mid inferolateral location.  Findings  consistent with ischemia.  This is an intermediate risk study.  The left ventricular ejection fraction is mildly decreased (45-54%).  Abnormal intermediate risk stress nuclear study with inferior basal thinning and mild to moderate ischemia in the basal inferior lateral wall.  The gated ejection fraction was 48% with global hypokinesis.    Medications:   . aspirin EC  81 mg Oral Daily  . atorvastatin  40 mg Oral q1800  . enoxaparin (LOVENOX) injection  40 mg Subcutaneous Q24H  . isosorbide mononitrate  30 mg Oral Daily  . sodium chloride flush  3 mL Intravenous Q12H   Continuous Infusions: . sodium chloride       LOS: 0 days   Joseph ArtJessica U Andrea Ferrer  [facility]   *Please refer to [email or web address], password [email or web address] to get updated schedule on who will round on this patient, as hospitalists switch teams weekly. If 7PM-7AM, please contact night-coverage at [email or web address], password [email or web address] for any overnight needs.  02/23/2018, 1:07 PM

## 2018-02-23 NOTE — Progress Notes (Addendum)
I spoke with Interpreting Services via telephone [phone]) and confirmed that an interpreter that uses sign language will be here from 12:30p-3 or 4 pm for pre-and post-cath procedure today.  Cath lab staff and patient informed of plan

## 2018-02-24 ENCOUNTER — Encounter (HOSPITAL_COMMUNITY): Payer: Self-pay | Admitting: Cardiovascular Disease

## 2018-02-24 LAB — GC/CHLAMYDIA PROBE AMP (~~LOC~~) NOT AT ARMC
Chlamydia: NEGATIVE
Neisseria Gonorrhea: NEGATIVE

## 2018-03-09 ENCOUNTER — Ambulatory Visit (INDEPENDENT_AMBULATORY_CARE_PROVIDER_SITE_OTHER): Payer: Medicare Other | Admitting: Adult Health

## 2018-03-09 ENCOUNTER — Encounter: Payer: Self-pay | Admitting: Adult Health

## 2018-03-09 VITALS — BP 112/70 | HR 50 | Ht 72.0 in | Wt 162.8 lb

## 2018-03-09 DIAGNOSIS — I2510 Atherosclerotic heart disease of native coronary artery without angina pectoris: Secondary | ICD-10-CM | POA: Diagnosis not present

## 2018-03-09 DIAGNOSIS — E7800 Pure hypercholesterolemia, unspecified: Secondary | ICD-10-CM | POA: Diagnosis not present

## 2018-03-09 LAB — HIV ANTIBODY (ROUTINE TESTING W REFLEX): HIV Screen 4th Generation wRfx: NONREACTIVE

## 2018-03-09 NOTE — Progress Notes (Signed)
Cardiology Office Note   Date:  03/09/2018   ID:  Bobby ReilWilliam Rudie, DOB 1951-05-22, MRN [id number]  PCP:  Patient, No Pcp Per  Cardiologist:  Dr. Herbie BaltimoreHarding  Chief Complaint  Patient presents with  . Coronary Artery Disease     History of Present Illness: Bobby Griffith is a 67 y.o. male who is deaf requiring interpretor, who presents for post hospital follow up after being seen on consultation for chest pain. He was found to have an abnormal stress test with intermediate risk finding a sizeable inferior perfusion defect concerning for possible infarct vs resting ischemia.   Cardiac cath was completed on 02/23/2018 revealing 100% CTO of the RCA in the mid stented segment, with distal collateral filling.The LAD had moderate ostial stenosis, the CX had mild non-obstructive disease. Medical management was recommended, Imdur was discontinued and low dose BB was started, he was to continue ASA and statin. Consideration for GI evaluation for chest discomfort.   He is without complaints of recurrent chest pain. He had tolerated the addition of metoprolol 0.5 mg BID without complaints of fatigue or recurrent chest pain.   Past Medical History:  Diagnosis Date  . Deaf, bilateral     Past Surgical History:  Procedure Laterality Date  . LEFT HEART CATH AND CORONARY ANGIOGRAPHY N/A 02/23/2018   Procedure: LEFT HEART CATH AND CORONARY ANGIOGRAPHY;  Surgeon: Kathleene HazelMcAlhany, Christopher D, MD;  Location: [facility];  Service: Cardiovascular;  Laterality: N/A;     Current Outpatient Medications  Medication Sig Dispense Refill  . aspirin EC 81 MG EC tablet Take 1 tablet (81 mg total) by mouth daily. 30 tablet 0  . atorvastatin (LIPITOR) 40 MG tablet Take 1 tablet (40 mg total) by mouth daily at 6 PM. 30 tablet 0  . camphor-menthol (SARNA) lotion Apply topically as needed for itching. 59 mL 0  . metoprolol tartrate (LOPRESSOR) 25 MG tablet Take 0.5 tablets (12.5 mg total) by mouth 2 (two) times daily. 60  tablet 0  . nitroGLYCERIN (NITROSTAT) 0.4 MG SL tablet Place 1 tablet (0.4 mg total) under the tongue every 5 (five) minutes as needed for chest pain (CP or SOB). 30 tablet 0   No current facility-administered medications for this visit.     Allergies:   Patient has no known allergies.    Social History:  The patient  reports that he has never smoked. He has never used smokeless tobacco. He reports that he does not drink alcohol.   Family History:  The patient's family history is not on file.    ROS: All other systems are reviewed and negative. Unless otherwise mentioned in H&P    PHYSICAL EXAM: VS:  BP 112/70   Pulse (!) 50   Ht 6' (1.829 m)   Wt 162 lb 12.8 oz (73.8 kg)   BMI 22.08 kg/m  , BMI Body mass index is 22.08 kg/m. GEN: Well nourished, well developed, in no acute distress  HEENT: normal  Neck: no JVD, carotid bruits, or masses Cardiac: RRR; no murmurs, rubs, or gallops,no edema  Respiratory:  clear to auscultation bilaterally, normal work of breathing GI: soft, nontender, nondistended, + BS MS: no deformity or atrophy  Skin: warm and dry, no rash Neuro:  Strength and sensation are intact. He is deaf.  Psych: euthymic mood, full affect   EKG:  Not competed during this office visit   Recent Labs: 02/23/2018: BUN 21; Creatinine, Ser 1.16; Hemoglobin 14.3; Platelets 217; Potassium 4.7; Sodium 137  Lipid Panel    Component Value Date/Time   CHOL 162 02/21/2018 0408   TRIG 97 02/21/2018 0408   HDL 36 (L) 02/21/2018 0408   CHOLHDL 4.5 02/21/2018 0408   VLDL 19 02/21/2018 0408   LDLCALC 107 (H) 02/21/2018 0408      Wt Readings from Last 3 Encounters:  03/09/18 162 lb 12.8 oz (73.8 kg)  02/23/18 159 lb 4.8 oz (72.3 kg)  04/30/13 162 lb 3.2 oz (73.6 kg)      Other studies Reviewed: Conclusion 02/23/2018    Mid RCA lesion is 100% stenosed.  Prox RCA lesion is 40% stenosed.  Ost Cx to Prox Cx lesion is 50% stenosed.  Ost 1st Mrg to 1st Mrg  lesion is 50% stenosed.  Prox LAD to Mid LAD lesion is 30% stenosed.  Ost 1st Diag lesion is 60% stenosed.   1. The RCA is large dominant artery with total occlusion of the mid stented segment. The distal RCA, PDA and posterolateral artery fill from left to right collaterals. The RCA occlusion is chronic 2. The LAD is a large caliber vessel that courses to the apex. The mid segment has a mild stenosis. The small to moderate caliber diagonal branch has moderate ostial stenosis.  3. The Circumflex artery is a large caliber vessel with a large first obtuse marginal branch a small second obtuse marginal branch. The proximal Circumflex has moderate non-obstructive disease. The first obtuse marginal branch has moderate non-obstructive disease in the proximal segment.   Recommendations: Medical management of CAD. The RCA occlusion is felt to be chronic with good collateral filling of the distal branches. Moderate disease in the Circumflex.      ASSESSMENT AND PLAN:  1. CAD: He is s/p cardiac cath, with no new areas of stenosis, unchanged from 2010 (see above note). He has been started on metoprolol 12.5 mg BID and is tolerating this well. Continue this along with ASA therapy, and statin.   2. Hypercholesterolemia:  Continue statin therapy with atorvastatin. Goal of LDL , 70. Follow up lipids and LFT's on next office visit.   Current medicines are reviewed at length with the patient today.    Labs/ tests ordered today include: None   Bettey Mare. Liborio Nixon, ANP, AACC   03/09/2018 12:23 PM    [facility] [address], [place], [place] [address] Phone: [phone]; Fax: [phone]

## 2018-03-09 NOTE — Patient Instructions (Addendum)
Medication Instructions:  NO CHANGES- Your physician recommends that you continue on your current medications as directed. Please refer to the Current Medication list given to you today.  If you need a refill on your cardiac medications before your next appointment, please call your pharmacy.  Follow-Up: Your physician wants you to follow-up in: 6 MONTHS WITH DR Herbie BaltimoreHARDING You should receive a reminder letter in the mail two months in advance. If you do not receive a letter, please call our office in JAN 2020 to schedule your MAR 2020 follow-up appointment.   Thank you for choosing [facility]!!

## 2018-03-15 ENCOUNTER — Encounter: Payer: Self-pay | Admitting: Adult Health

## 2018-12-06 ENCOUNTER — Telehealth: Payer: Self-pay | Admitting: Cardiology

## 2018-12-06 NOTE — Telephone Encounter (Signed)
I am sorry, but I think he will need to find PCP or see dermatologist. I gave him symptomatic relief but since this is an ongoing problem, he will need further assessment by a specialist. Thank you

## 2018-12-06 NOTE — Telephone Encounter (Signed)
Spoke with pt through interrupter as he is deaf, he reports having the rash and red bumps all over his body like he did in September when he was seen. He reports the cream is not working and he wants to know what else he can use. He does not have a pcp. Will forward to [employer] for advise.

## 2018-12-06 NOTE — Telephone Encounter (Signed)
Left message via interrupter of recommendations.

## 2018-12-07 ENCOUNTER — Telehealth: Payer: Self-pay | Admitting: Adult Health

## 2018-12-07 NOTE — Telephone Encounter (Signed)
Spoke with pt via interrupter. Pt state he continues to itch all over and have blister on his arm. Advised pt of Jory Sims, DNP recommendation and reiterated that this is a cardiologist office. Pt became upset and said we are no help and disconnected the line.

## 2018-12-07 NOTE — Telephone Encounter (Signed)
Follow Up:   Pt is calling again today. He says the rash is itching so much. He says he wants to be seen by somebody here. He says he does not a PCP.  His PCP is in [place] and that is too far for him to go.

## 2019-02-01 ENCOUNTER — Telehealth: Payer: Self-pay | Admitting: *Deleted

## 2019-02-01 NOTE — Telephone Encounter (Signed)
Sign language interpreted call from [name].   [phone])  "Hi.  I have a question about skin.  I have itchy, red bumps to my chest.  The creams and lotions I've tried are not making it any better."   Interpreter notified this nurse investigating chemotherapy treatment received for side effects reveals this is not our patient.    "I do not have a PCP."  Advised to go to [facility] for evaluation.  Address and phone number for site [address] [place], [place] provided as requested.  Currently denies further questions or needs.

## 2019-05-22 DIAGNOSIS — Z20828 Contact with and (suspected) exposure to other viral communicable diseases: Secondary | ICD-10-CM | POA: Diagnosis not present

## 2019-09-17 ENCOUNTER — Ambulatory Visit: Payer: Medicare Other | Attending: Internal Medicine

## 2019-09-17 DIAGNOSIS — Z23 Encounter for immunization: Secondary | ICD-10-CM

## 2019-09-17 NOTE — Progress Notes (Signed)
   [facility]  Name:  Bobby Griffith.    MRN: [id number] DOB: Sep 21, 1950  09/17/2019  Mr. Pianka was observed post Covid-19 immunization for 15 minutes without incident. He was provided with Vaccine Information Sheet and instruction to access the V-Safe system.   Mr. Mehra was instructed to call 911 with any severe reactions post vaccine: Marland Kitchen Difficulty breathing  . Swelling of face and throat  . A fast heartbeat  . A bad rash all over body  . Dizziness and weakness   Immunizations Administered    Name Date Dose VIS Date Route   Pfizer COVID-19 Vaccine 09/17/2019 11:20 AM 0.3 mL 06/08/2019 Intramuscular   Manufacturer: [employer], [employer]   Lot: [id number]   NDC: [address]-5

## 2019-10-10 ENCOUNTER — Ambulatory Visit: Payer: Medicare Other | Attending: Internal Medicine

## 2019-10-10 IMAGING — DX DG CHEST 2V
2 series · 2 of 2 positions shown · non-contrast
Comparison: None.

CLINICAL DATA: 67 y/o M; centralized chest pain radiating to the
left shoulder.

EXAM:
CHEST - 2 VIEW

[chest pa]
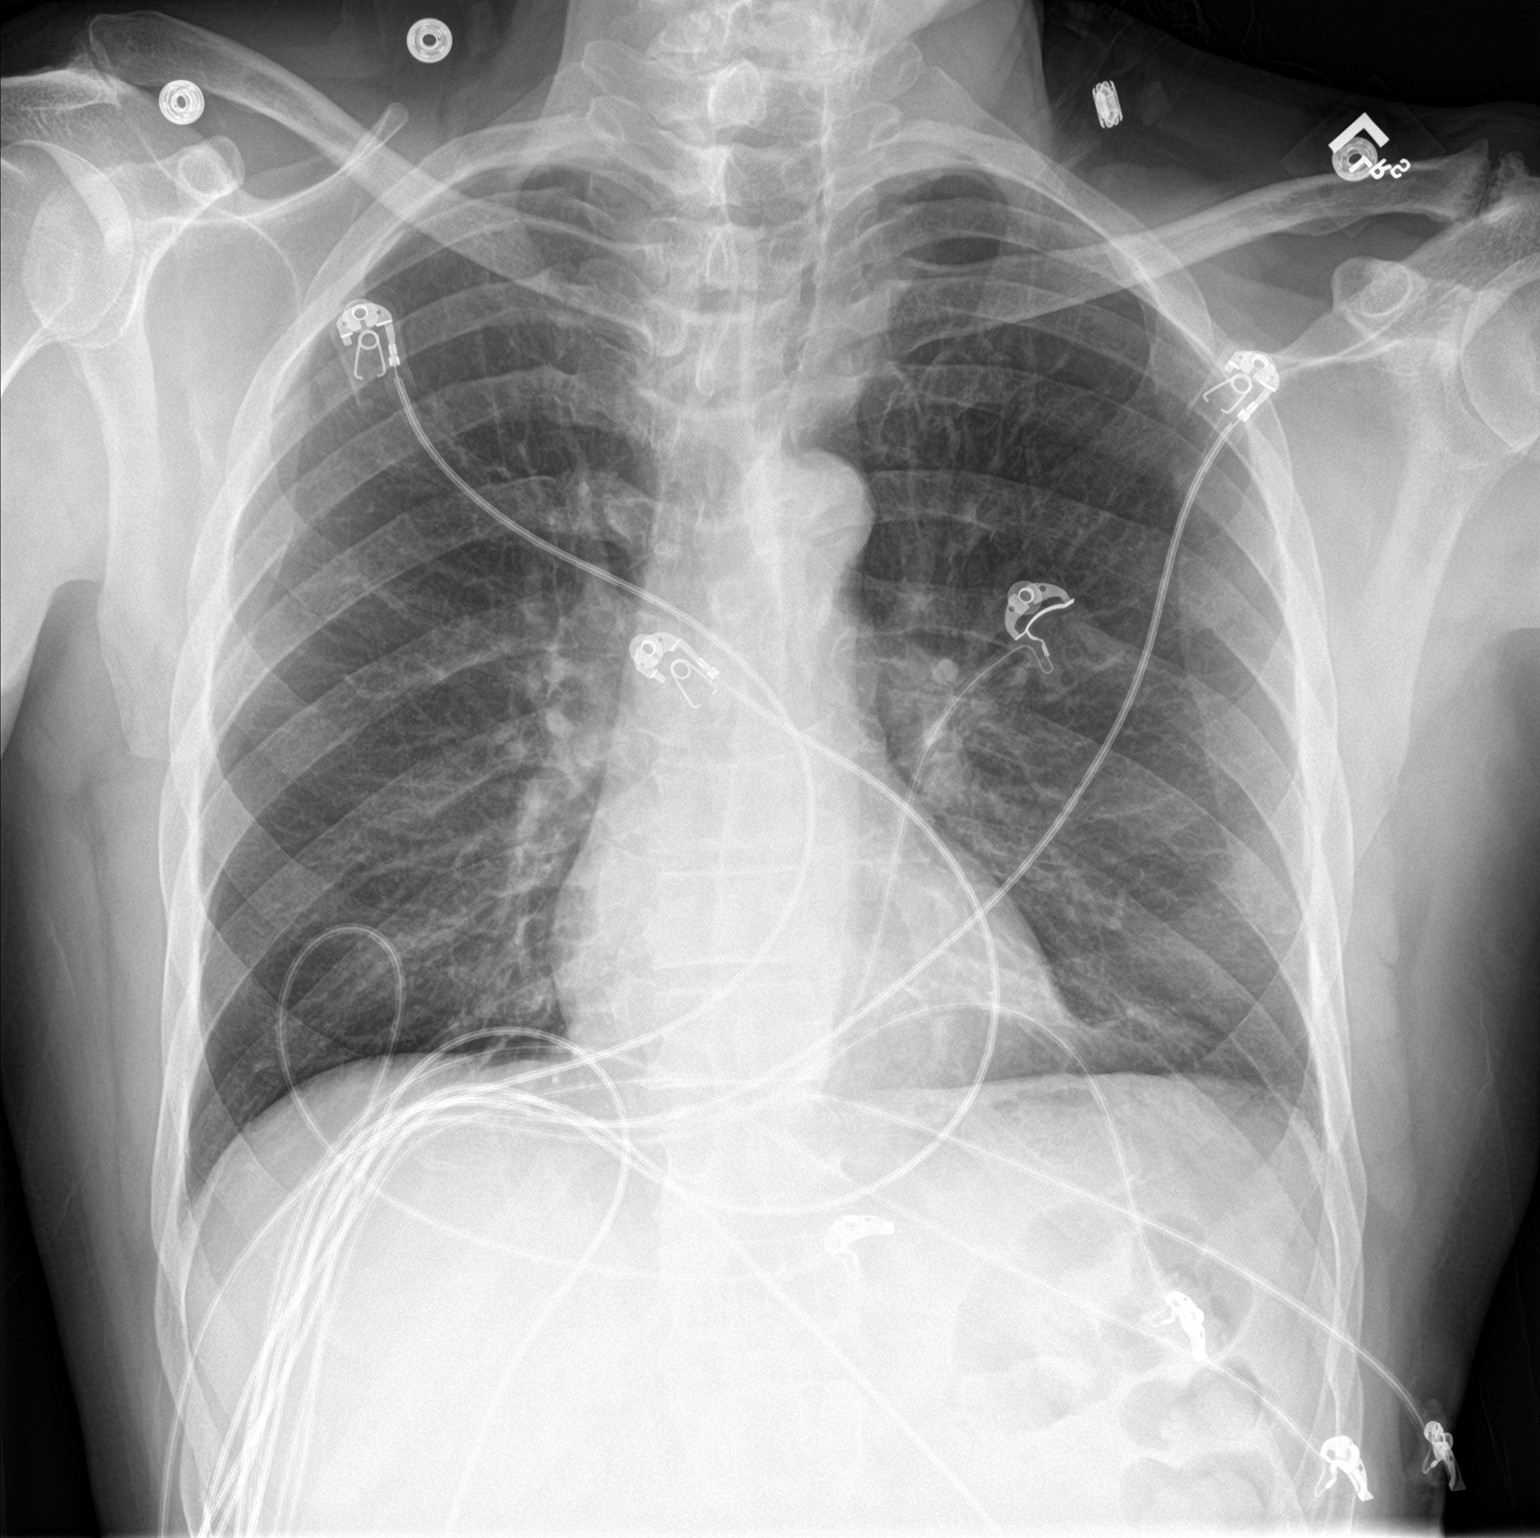

[chest lat]
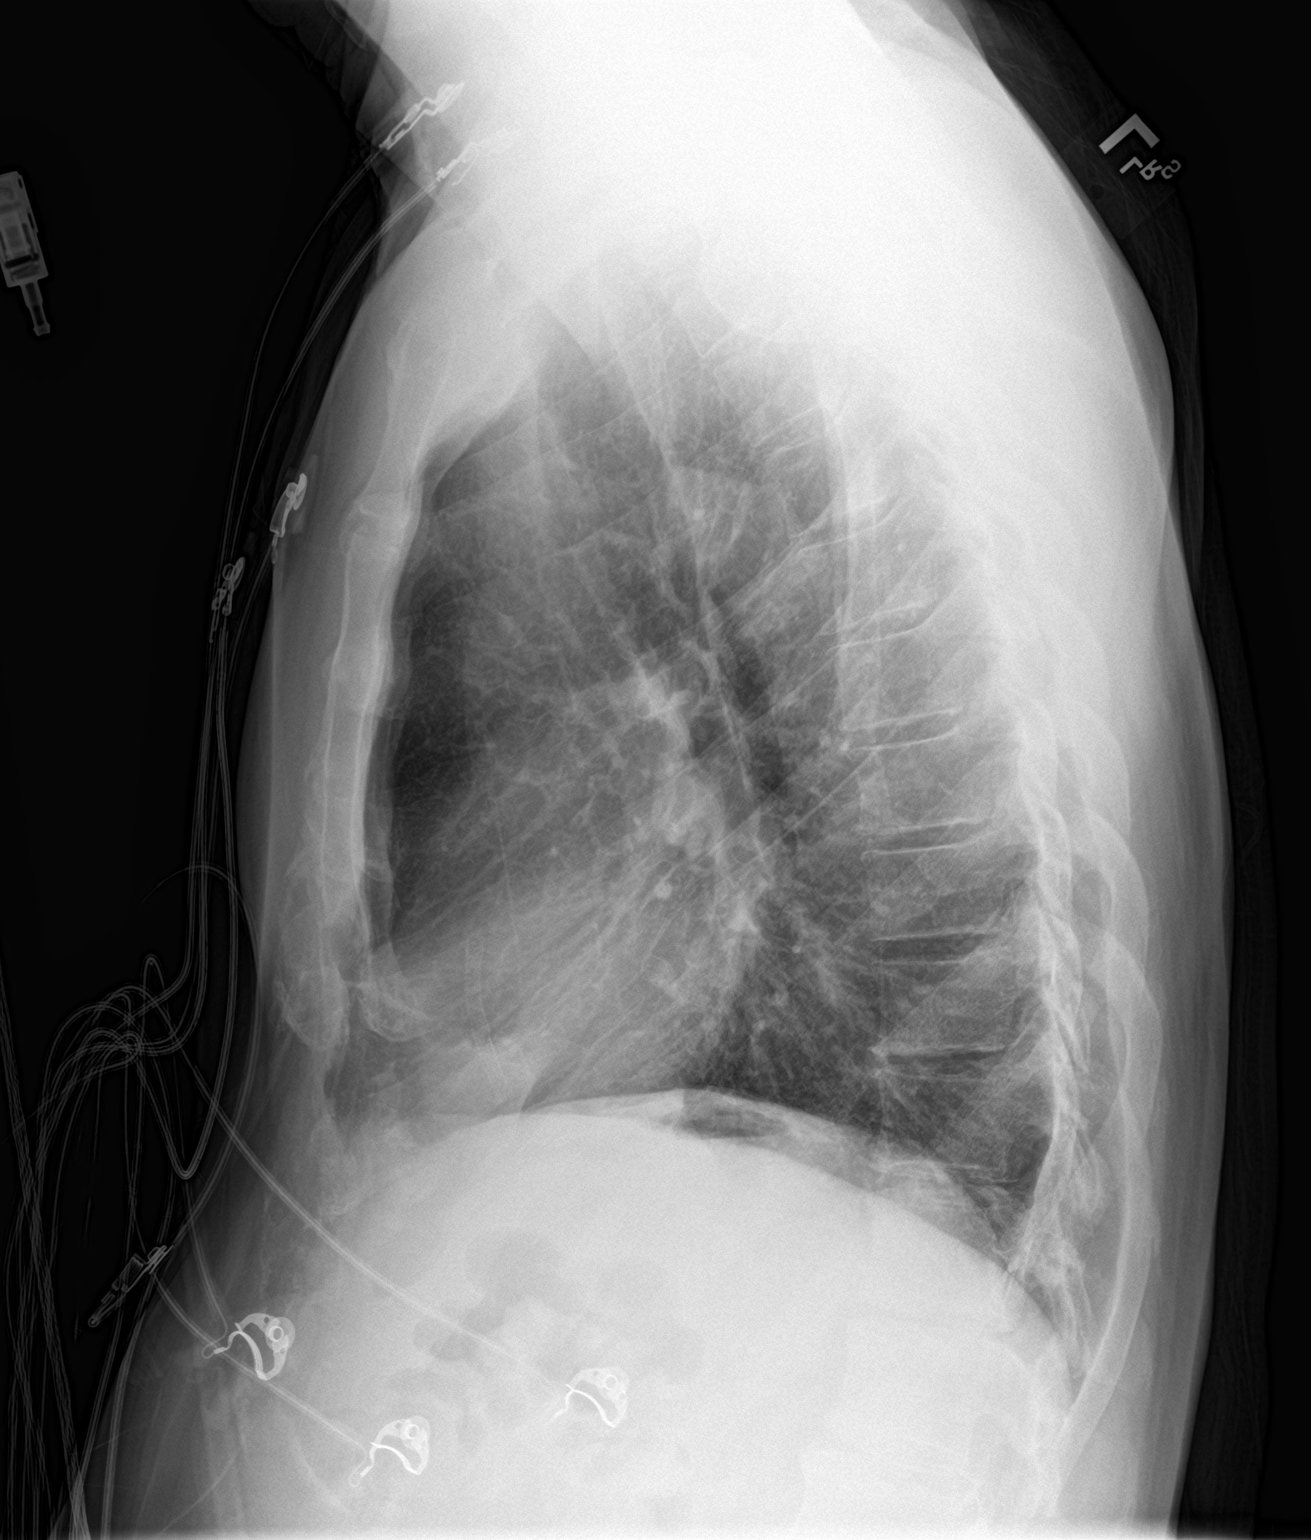

[2 of 2 positions shown; findings below may reference images not displayed]

FINDINGS: The heart size and mediastinal contours are within normal limits.
Both lungs are clear. The visualized skeletal structures are
unremarkable.
IMPRESSION: No active cardiopulmonary disease.

By: Blain Jumper M.D.

## 2019-10-11 ENCOUNTER — Ambulatory Visit: Payer: Medicare Other | Attending: Internal Medicine

## 2019-10-11 DIAGNOSIS — Z23 Encounter for immunization: Secondary | ICD-10-CM

## 2019-10-11 NOTE — Progress Notes (Signed)
   [facility]  Name:  Bobby Griffith.    MRN: [id number] DOB: 10/09/1950  10/11/2019  Bobby Griffith was observed post Covid-19 immunization for 15 minutes without incident. He was provided with Vaccine Information Sheet and instruction to access the V-Safe system.   Bobby Griffith was instructed to call 911 with any severe reactions post vaccine: Marland Kitchen Difficulty breathing  . Swelling of face and throat  . A fast heartbeat  . A bad rash all over body  . Dizziness and weakness   Immunizations Administered    Name Date Dose VIS Date Route   Pfizer COVID-19 Vaccine 10/11/2019 11:56 AM 0.3 mL 06/08/2019 Intramuscular   Manufacturer: [employer], [employer]   Lot: [id number]   NDC: [address]-8

## 2019-10-16 ENCOUNTER — Emergency Department (HOSPITAL_COMMUNITY)
Admission: EM | Admit: 2019-10-16 | Discharge: 2019-10-16 | Disposition: A | Discharge status: Home / Self Care | Payer: Medicare Other | Attending: Emergency Medicine | Admitting: Emergency Medicine

## 2019-10-16 ENCOUNTER — Encounter (HOSPITAL_COMMUNITY): Payer: Self-pay | Admitting: Emergency Medicine

## 2019-10-16 ENCOUNTER — Other Ambulatory Visit: Payer: Self-pay

## 2019-10-16 DIAGNOSIS — R300 Dysuria: Secondary | ICD-10-CM | POA: Diagnosis not present

## 2019-10-16 DIAGNOSIS — R319 Hematuria, unspecified: Secondary | ICD-10-CM | POA: Insufficient documentation

## 2019-10-16 DIAGNOSIS — Z79899 Other long term (current) drug therapy: Secondary | ICD-10-CM | POA: Insufficient documentation

## 2019-10-16 DIAGNOSIS — N390 Urinary tract infection, site not specified: Secondary | ICD-10-CM | POA: Diagnosis not present

## 2019-10-16 DIAGNOSIS — Z7982 Long term (current) use of aspirin: Secondary | ICD-10-CM | POA: Diagnosis not present

## 2019-10-16 LAB — CBC
HCT: 47.8 % (ref 39.0–52.0)
Hemoglobin: 15.9 g/dL (ref 13.0–17.0)
MCH: 31.7 pg (ref 26.0–34.0)
MCHC: 33.3 g/dL (ref 30.0–36.0)
MCV: 95.2 fL (ref 80.0–100.0)
Platelets: 211 10*3/uL (ref 150–400)
RBC: 5.02 MIL/uL (ref 4.22–5.81)
RDW: 13.7 % (ref 11.5–15.5)
WBC: 11.3 10*3/uL — ABNORMAL HIGH (ref 4.0–10.5)
nRBC: 0 % (ref 0.0–0.2)

## 2019-10-16 LAB — BASIC METABOLIC PANEL
Anion gap: 11 (ref 5–15)
BUN: 13 mg/dL (ref 8–23)
CO2: 21 mmol/L — ABNORMAL LOW (ref 22–32)
Calcium: 9.3 mg/dL (ref 8.9–10.3)
Chloride: 106 mmol/L (ref 98–111)
Creatinine, Ser: 1.24 mg/dL (ref 0.61–1.24)
GFR calc Af Amer: >60 mL/min (ref >60)
GFR calc non Af Amer: 59 mL/min — ABNORMAL LOW (ref >60)
Glucose, Bld: 118 mg/dL — ABNORMAL HIGH (ref 70–99)
Potassium: 4.3 mmol/L (ref 3.5–5.1)
Sodium: 138 mmol/L (ref 135–145)

## 2019-10-16 LAB — URINALYSIS, ROUTINE W REFLEX MICROSCOPIC
Bilirubin Urine: NEGATIVE
Glucose, UA: NEGATIVE mg/dL
Ketones, ur: NEGATIVE mg/dL
Nitrite: NEGATIVE
Protein, ur: 30 mg/dL — AB
Specific Gravity, Urine: 1.021 (ref 1.005–1.030)
WBC, UA: >50 WBC/hpf — ABNORMAL HIGH (ref 0–5)
pH: 5 (ref 5.0–8.0)

## 2019-10-16 LAB — TROPONIN I (HIGH SENSITIVITY)
Troponin I (High Sensitivity): 11 ng/L (ref <18)
Troponin I (High Sensitivity): 8 ng/L (ref <18)

## 2019-10-16 LAB — POC OCCULT BLOOD, ED: Fecal Occult Bld: NEGATIVE

## 2019-10-16 MED ORDER — CEPHALEXIN 250 MG PO CAPS
500.0000 mg | ORAL_CAPSULE | Freq: Once | ORAL | Status: AC
  Administered 2019-10-16: 500 mg via ORAL
  Filled 2019-10-16: qty 2

## 2019-10-16 MED ORDER — CEPHALEXIN 500 MG PO CAPS: 500.0000 mg | ORAL_CAPSULE | Freq: Four times a day (QID) | ORAL | 0 refills | Status: DC

## 2019-10-16 NOTE — ED Provider Notes (Signed)
[facility] EMERGENCY DEPARTMENT Provider Note   CSN: [id number] Arrival date & time: 10/16/19  0645     History Chief Complaint  Patient presents with  . painful urination  . Gastroesophageal Reflux    Bobby Griffith. is a 69 y.o. male. Patient interviewed with American sign language interpreter at the bedside. HPI Is here for evaluation of hematuria, dysuria, urinary frequency, which started yesterday.  No prior similar problems.  No known prostate disorder.  No fever, chills, nausea, vomiting, weakness or dizziness.  There are no other known modifying factors.    Past Medical History:  Diagnosis Date  . Deaf, bilateral     Patient Active Problem List   Diagnosis Date Noted  . Chest pain 02/21/2018    Past Surgical History:  Procedure Laterality Date  . ABDOMINAL SURGERY    . LEFT HEART CATH AND CORONARY ANGIOGRAPHY N/A 02/23/2018   Procedure: LEFT HEART CATH AND CORONARY ANGIOGRAPHY;  Surgeon: Kathleene Hazel, MD;  [facility]: [facility];  Service: Cardiovascular;  Laterality: N/A;       No family history on file.  Social History   Tobacco Use  . Smoking status: Never Smoker  . Smokeless tobacco: Never Used  Substance Use Topics  . Alcohol use: No  . Drug use: Not on file    Home Medications Prior to Admission medications   Medication Sig Start Date End Date Taking? Authorizing Provider  aspirin EC 81 MG EC tablet Take 1 tablet (81 mg total) by mouth daily. Patient not taking: Reported on 10/16/2019 02/24/18   Joseph Art, DO  atorvastatin (LIPITOR) 40 MG tablet Take 1 tablet (40 mg total) by mouth daily at 6 PM. Patient not taking: Reported on 10/16/2019 02/23/18   Joseph Art, DO  camphor-menthol [facility]) lotion Apply topically as needed for itching. Patient not taking: Reported on 10/16/2019 02/23/18   Joseph Art, DO  cephALEXin (KEFLEX) 500 MG capsule Take 1 capsule (500 mg total) by mouth 4 (four) times  daily. 10/16/19   Mancel Bale, MD  cyclobenzaprine (FLEXERIL) 5 MG tablet Take 1 tablet (5 mg total) by mouth 3 (three) times daily as needed for muscle spasms. Patient not taking: Reported on 10/16/2019 06/17/17   Ward, Chase Picket, PA-C  diphenhydrAMINE (BENADRYL) 25 MG tablet Take 1 tablet (25 mg total) by mouth every 6 (six) hours as needed for itching. Patient not taking: Reported on 10/16/2019 06/17/17   Ward, Chase Picket, PA-C  doxycycline (VIBRAMYCIN) 100 MG capsule Take 1 capsule (100 mg total) by mouth 2 (two) times daily. Patient not taking: Reported on 10/16/2019 06/17/17   Ward, Chase Picket, PA-C  metoprolol tartrate (LOPRESSOR) 25 MG tablet Take 0.5 tablets (12.5 mg total) by mouth 2 (two) times daily. Patient not taking: Reported on 10/16/2019 02/23/18   Joseph Art, DO  nitroGLYCERIN (NITROSTAT) 0.4 MG SL tablet Place 1 tablet (0.4 mg total) under the tongue every 5 (five) minutes as needed for chest pain (CP or SOB). Patient not taking: Reported on 10/16/2019 02/23/18   Joseph Art, DO  permethrin (ELIMITE) 5 % cream Apply to entire body from neck down at night. Rinse off in the morning. Repeat in 1 week if symptoms are not resolved. Patient not taking: Reported on 10/16/2019 06/17/17   Ward, Chase Picket, PA-C    Allergies    Patient has no known allergies.  Review of Systems   Review of Systems  All other systems  reviewed and are negative.   Physical Exam Updated Vital Signs BP (!) 118/102   Pulse 65   Temp 98 F (36.7 C) (Oral)   Resp 18   SpO2 98%   Physical Exam Vitals and nursing note reviewed.  Constitutional:      General: He is not in acute distress.    Appearance: He is well-developed. He is not ill-appearing, toxic-appearing or diaphoretic.  HENT:     Head: Normocephalic and atraumatic.     Right Ear: External ear normal.     Left Ear: External ear normal.  Eyes:     Conjunctiva/sclera: Conjunctivae normal.     Pupils: Pupils are equal,  round, and reactive to light.  Neck:     Trachea: Phonation normal.  Cardiovascular:     Rate and Rhythm: Normal rate and regular rhythm.     Heart sounds: Normal heart sounds.  Pulmonary:     Effort: Pulmonary effort is normal.     Breath sounds: Normal breath sounds.  Abdominal:     General: There is no distension.     Palpations: Abdomen is soft.     Tenderness: There is no abdominal tenderness. There is no guarding.  Genitourinary:    Comments: Uncircumcised.  No significant urethral discharge.  Normal penis.  Normal scrotum and scrotal contents.  Normal anus.  No stool in rectal vault.  Mucus from digital examining finger sent for Hemoccult testing. Musculoskeletal:        General: Normal range of motion.     Cervical back: Normal range of motion and neck supple.  Skin:    General: Skin is warm and dry.  Neurological:     Mental Status: He is alert and oriented to person, place, and time.     Cranial Nerves: No cranial nerve deficit.     Sensory: No sensory deficit.     Motor: No abnormal muscle tone.     Coordination: Coordination normal.  Psychiatric:        Behavior: Behavior normal.        Thought Content: Thought content normal.        Judgment: Judgment normal.     ED Results / Procedures / Treatments   Labs (all labs ordered are listed, but only abnormal results are displayed) Labs Reviewed  URINALYSIS, ROUTINE W REFLEX MICROSCOPIC - Abnormal; Notable for the following components:      Result Value   Color, Urine AMBER (*)    APPearance CLOUDY (*)    Hgb urine dipstick LARGE (*)    Protein, ur 30 (*)    Leukocytes,Ua LARGE (*)    WBC, UA >50 (*)    Bacteria, UA RARE (*)    Non Squamous Epithelial 0-5 (*)    All other components within normal limits  BASIC METABOLIC PANEL - Abnormal; Notable for the following components:   CO2 21 (*)    Glucose, Bld 118 (*)    GFR calc non Af Amer 59 (*)    All other components within normal limits  CBC - Abnormal;  Notable for the following components:   WBC 11.3 (*)    All other components within normal limits  URINE CULTURE  POC OCCULT BLOOD, ED  TROPONIN I (HIGH SENSITIVITY)  TROPONIN I (HIGH SENSITIVITY)    EKG EKG Interpretation  Date/Time:  Tuesday October 16 2019 07:09:34 EDT Ventricular Rate:  74 PR Interval:  162 QRS Duration: 88 QT Interval:  372 QTC Calculation: 412 R Axis:  76 Text Interpretation: Normal sinus rhythm Normal ECG Since last tracing rate faster Otherwise no significant change Confirmed by Mancel Bale [phone]) on 10/16/2019 9:24:43 AM   Radiology No results found.  Procedures Procedures (including critical care time)  Medications Ordered in ED Medications  cephALEXin (KEFLEX) capsule 500 mg (500 mg Oral Given 10/16/19 1323)    ED Course  I have reviewed the triage vital signs and the nursing notes.  Pertinent labs & imaging results that were available during my care of the patient were reviewed by me and considered in my medical decision making (see chart for details).  Clinical Course as of Oct 16 1334  Tue Oct 16, 2019  0923 Normal except white count elevated  CBC(!) [EW]  8786 Normal  Troponin I (High Sensitivity) [EW]  0924 Normal except CO2 low, glucose high, GFR low  Basic metabolic panel(!) [EW]    Clinical Course User Index [EW] Mancel Bale, MD   MDM Rules/Calculators/A&P                       Patient Vitals for the past 24 hrs:  BP Temp Temp src Pulse Resp SpO2  10/16/19 1230 -- -- -- 65 18 98 %  10/16/19 1045 (!) 118/102 -- -- 65 (!) 28 98 %  10/16/19 0649 110/80 98 F (36.7 C) Oral 91 17 98 %    1:12 PM Reevaluation with update and discussion. After initial assessment and treatment, an updated evaluation reveals he remains comfortable.  He complains of a rash on his thorax which he describes as "a boil."  On evaluation he has scattered dark flat rash, slightly irregular, 4 mm, with a few areas associated with pustules.  There is  no active drainage, fluctuance or bleeding.  Or rash has some appearance of folliculitis however it would be in an atypical area.  Rash does not appear to be scabies or shingles.  Findings discussed and questions answered. Mancel Bale   Medical Decision Making:  This patient is presenting for evaluation of hematuria, which does require a range of treatment options, and is a complaint that involves a moderate risk of morbidity and mortality. The differential diagnoses include kidney stone, UTI, prostatitis. I decided  to review old records, and in summary generally healthy elderly man with deafness.  No history of urologic disorders. I did not require additional historical information from anyone. Clinical Laboratory Tests Ordered, included CBC,  electrolytes, urinalysis.  Results reviewed evaluation consistent with uncomplicated UTI Radiologic Tests Ordered, included none. Urinary bladder scan, 55 cc present, not consistent with bladder outlet obstruction.  Critical Interventions-clinical laboratory evaluation  After These Interventions, the Patient was reevaluated and was found stable for discharge.  Doubt urosepsis, bladder outlet obstruction, urolithiasis or hemodynamic instability  CRITICAL CARE-no Performed by: Mancel Bale   Nursing Notes Reviewed/ Care Coordinated Applicable Imaging Reviewed Interpretation of Laboratory Data incorporated into ED treatment  The patient appears reasonably screened and/or stabilized for discharge and I doubt any other medical condition or other [facility] requiring further screening, evaluation, or treatment in the ED at this time prior to discharge.  Plan: Home Medications-continue current; Home Treatments-rest, fluids; return here if the recommended treatment, does not improve the symptoms; Recommended follow up-PCP checkup 10 to 14 days.    Final Clinical Impression(s) / ED Diagnoses Final diagnoses:  Urinary tract infection with hematuria, site  unspecified    Rx / DC Orders ED Discharge Orders  Ordered    cephALEXin (KEFLEX) 500 MG capsule  4 times daily     10/16/19 1311           Daleen Bo, MD 10/16/19 1336

## 2019-10-16 NOTE — ED Triage Notes (Signed)
Per EMS, pt from home and is deaf.  Pt reports painful burning sensation when he tries to urinate and trouble urinating X2 days. Pt states there is blood in his urine and he has lower back pain.   124/78 84Pulse 100 % RA 97.9 temp

## 2019-10-16 NOTE — Discharge Instructions (Signed)
Make sure you are drinking plenty of fluids, and take the prescription antibiotic as directed.  Use Tylenol, every 4 hours as needed for pain or fever.  See your doctor in 10 to 14 days for a checkup, repeat urinalysis, and evaluation of the rash on your thorax.

## 2019-10-18 LAB — URINE CULTURE: Culture: >=100000 — AB

## 2019-10-19 ENCOUNTER — Telehealth: Payer: Self-pay

## 2019-10-19 NOTE — Telephone Encounter (Signed)
Post ED Visit - Positive Culture Follow-up  Culture report reviewed by antimicrobial stewardship pharmacist: [facility] []  , Pharm.D. []  Enzo Bi, Pharm.D., BCPS AQ-ID []  , Pharm.D., BCPS []  Celedonio Miyamoto, Pharm.D., BCPS []  [place]oss, Garvin Fila.D., BCPS, AAHIVP []  , Pharm.D., BCPS, AAHIVP []  Georgina Pillion, PharmD, BCPS []  , PharmD, BCPS []  [place], PharmD, BCPS []  [address], PharmD []  , PharmD, BCPS []  Estella Husk, PharmD [facility] []  Lysle Pearl, PharmD []  , PharmD []  Phillips Climes, PharmD []  , Rph []  Agapito Games) , PharmD []  Verlan Friends, PharmD []  , PharmD []  Mervyn Gay, PharmD []  , PharmD []  Vinnie Level, PharmD []  Princella Pellegrini, PharmD []  , PharmD []  Len Childs, PharmD   Positive urine culture Treated with Cephalexin, organism sensitive to the same and no further patient follow-up is required at this time.  10/19/2019, 10:17 AM

## 2019-11-03 ENCOUNTER — Encounter (HOSPITAL_COMMUNITY): Payer: Self-pay | Admitting: *Deleted

## 2019-11-03 ENCOUNTER — Other Ambulatory Visit: Payer: Self-pay

## 2019-11-03 ENCOUNTER — Emergency Department (HOSPITAL_COMMUNITY)
Admission: EM | Admit: 2019-11-03 | Discharge: 2019-11-04 | Disposition: A | Discharge status: Home / Self Care | Payer: Medicare Other | Attending: Emergency Medicine | Admitting: Emergency Medicine

## 2019-11-03 DIAGNOSIS — Z7982 Long term (current) use of aspirin: Secondary | ICD-10-CM | POA: Insufficient documentation

## 2019-11-03 DIAGNOSIS — N3000 Acute cystitis without hematuria: Secondary | ICD-10-CM | POA: Diagnosis not present

## 2019-11-03 DIAGNOSIS — Z79899 Other long term (current) drug therapy: Secondary | ICD-10-CM | POA: Diagnosis not present

## 2019-11-03 DIAGNOSIS — R300 Dysuria: Secondary | ICD-10-CM | POA: Diagnosis present

## 2019-11-03 NOTE — ED Triage Notes (Signed)
[  ossible hematuria  Waiting on an interpreter

## 2019-11-03 NOTE — ED Notes (Signed)
A deaf interpreter has been called for this pt

## 2019-11-03 NOTE — ED Notes (Signed)
Called to request a sign lang. Int left VM

## 2019-11-04 DIAGNOSIS — N3000 Acute cystitis without hematuria: Secondary | ICD-10-CM | POA: Diagnosis not present

## 2019-11-04 LAB — URINALYSIS, ROUTINE W REFLEX MICROSCOPIC
Bilirubin Urine: NEGATIVE
Glucose, UA: NEGATIVE mg/dL
Ketones, ur: NEGATIVE mg/dL
Nitrite: NEGATIVE
Protein, ur: 30 mg/dL — AB
RBC / HPF: >50 RBC/hpf — ABNORMAL HIGH (ref 0–5)
Specific Gravity, Urine: 1.012 (ref 1.005–1.030)
WBC, UA: >50 WBC/hpf — ABNORMAL HIGH (ref 0–5)
pH: 5 (ref 5.0–8.0)

## 2019-11-04 MED ORDER — CEPHALEXIN 500 MG PO CAPS: 500.0000 mg | ORAL_CAPSULE | Freq: Four times a day (QID) | ORAL | 0 refills | Status: DC

## 2019-11-04 MED ORDER — SODIUM CHLORIDE 0.9 % IV SOLN
1.0000 g | Freq: Once | INTRAVENOUS | Status: AC
  Administered 2019-11-04: 1 g via INTRAVENOUS
  Filled 2019-11-04: qty 10

## 2019-11-04 NOTE — ED Provider Notes (Signed)
[facility] EMERGENCY DEPARTMENT Provider Note   CSN: [id number] Arrival date & time: 11/03/19  2307     History Chief Complaint  Patient presents with  . Hematuria    Bobby Griffith. is a 69 y.o. male with a hx of deafness presents to the Emergency Department complaining of gradual, persistent, progressively worsening dysuria onset 4 days ago. Associated symptoms include pneumaturia, lower abdominal pain and low back pain.  Patient reports that urinating makes all of his symptoms worse.  Nothing seems to make them better.  He reports he was seen several weeks ago for similar symptoms and given antibiotic.  He shows me a bottle of Keflex.  He reports he took this as directed and he felt significantly better until the symptoms returned on Wednesday.  He reports they are the same as when he was diagnosed with a UTI several weeks ago.  Nothing seems to make the symptoms better.  He denies fever, chills, headache, neck pain, chest pain, shortness of breath, vomiting, diarrhea, weakness, dizziness, syncope, changes in appetite, painful defecation, testicular pain.  Patient reports he is sexually active with 2 females but always uses a condom.  Denies risk of venereal disease.  Denies penile discharge.   The history is provided by the patient and medical records. The history is limited by a language barrier. A language interpreter was used.       Past Medical History:  Diagnosis Date  . Deaf, bilateral     Patient Active Problem List   Diagnosis Date Noted  . Chest pain 02/21/2018    Past Surgical History:  Procedure Laterality Date  . ABDOMINAL SURGERY    . LEFT HEART CATH AND CORONARY ANGIOGRAPHY N/A 02/23/2018   Procedure: LEFT HEART CATH AND CORONARY ANGIOGRAPHY;  Surgeon: Kathleene Hazel, MD;  Location: [facility] INVASIVE CV LAB;  Service: Cardiovascular;  Laterality: N/A;       No family history on file.  Social History   Tobacco Use  . Smoking  status: Never Smoker  . Smokeless tobacco: Never Used  Substance Use Topics  . Alcohol use: No  . Drug use: Not on file    Home Medications Prior to Admission medications   Medication Sig Start Date End Date Taking? Authorizing Provider  acetaminophen (TYLENOL) 325 MG tablet Take 325 mg by mouth 2 (two) times daily as needed for mild pain.   Yes [provider]  aspirin EC 81 MG EC tablet Take 1 tablet (81 mg total) by mouth daily. Patient not taking: Reported on 10/16/2019 02/24/18   Joseph Art, DO  atorvastatin (LIPITOR) 40 MG tablet Take 1 tablet (40 mg total) by mouth daily at 6 PM. Patient not taking: Reported on 10/16/2019 02/23/18   Joseph Art, DO  camphor-menthol [facility]) lotion Apply topically as needed for itching. Patient not taking: Reported on 10/16/2019 02/23/18   Joseph Art, DO  cephALEXin (KEFLEX) 500 MG capsule Take 1 capsule (500 mg total) by mouth 4 (four) times daily. 11/04/19   Koury Roddy, Dahlia Client, PA-C  cyclobenzaprine (FLEXERIL) 5 MG tablet Take 1 tablet (5 mg total) by mouth 3 (three) times daily as needed for muscle spasms. Patient not taking: Reported on 10/16/2019 06/17/17   Ward, Chase Picket, PA-C  diphenhydrAMINE (BENADRYL) 25 MG tablet Take 1 tablet (25 mg total) by mouth every 6 (six) hours as needed for itching. Patient not taking: Reported on 10/16/2019 06/17/17   Ward, Chase Picket, PA-C  doxycycline (VIBRAMYCIN) 100  MG capsule Take 1 capsule (100 mg total) by mouth 2 (two) times daily. Patient not taking: Reported on 10/16/2019 06/17/17   Ward, Chase Picket, PA-C  metoprolol tartrate (LOPRESSOR) 25 MG tablet Take 0.5 tablets (12.5 mg total) by mouth 2 (two) times daily. Patient not taking: Reported on 10/16/2019 02/23/18   Joseph Art, DO  nitroGLYCERIN (NITROSTAT) 0.4 MG SL tablet Place 1 tablet (0.4 mg total) under the tongue every 5 (five) minutes as needed for chest pain (CP or SOB). Patient not taking: Reported on 10/16/2019 02/23/18    Joseph Art, DO  permethrin (ELIMITE) 5 % cream Apply to entire body from neck down at night. Rinse off in the morning. Repeat in 1 week if symptoms are not resolved. Patient not taking: Reported on 10/16/2019 06/17/17   Ward, Chase Picket, PA-C    Allergies    Patient has no known allergies.  Review of Systems   Review of Systems  Constitutional: Negative for appetite change, diaphoresis, fatigue, fever and unexpected weight change.  HENT: Negative for mouth sores.   Eyes: Negative for visual disturbance.  Respiratory: Negative for cough, chest tightness, shortness of breath and wheezing.   Cardiovascular: Negative for chest pain.  Gastrointestinal: Positive for abdominal pain. Negative for constipation, diarrhea, nausea and vomiting.  Endocrine: Negative for polydipsia, polyphagia and polyuria.  Genitourinary: Positive for dysuria and hematuria. Negative for frequency and urgency.  Musculoskeletal: Positive for back pain. Negative for neck stiffness.  Skin: Negative for rash.  Allergic/Immunologic: Negative for immunocompromised state.  Neurological: Negative for syncope, light-headedness and headaches.  Hematological: Does not bruise/bleed easily.  Psychiatric/Behavioral: Negative for sleep disturbance. The patient is not nervous/anxious.     Physical Exam Updated Vital Signs BP 138/82 (BP Location: Right Arm)   Pulse 73   Temp 98.5 F (36.9 C) (Oral)   Resp 15   Wt 73.8 kg   SpO2 98%   BMI 22.07 kg/m   Physical Exam Vitals and nursing note reviewed. Exam conducted with a chaperone present.  Constitutional:      General: He is not in acute distress.    Appearance: He is not diaphoretic.  HENT:     Head: Normocephalic.  Eyes:     General: No scleral icterus.    Conjunctiva/sclera: Conjunctivae normal.  Cardiovascular:     Rate and Rhythm: Normal rate and regular rhythm.     Pulses: Normal pulses.          Radial pulses are 2+ on the right side and 2+ on the  left side.  Pulmonary:     Effort: No tachypnea, accessory muscle usage, prolonged expiration, respiratory distress or retractions.     Breath sounds: No stridor.     Comments: Equal chest rise. No increased work of breathing. Abdominal:     General: There is no distension.     Palpations: Abdomen is soft.     Tenderness: There is no abdominal tenderness. There is no right CVA tenderness, left CVA tenderness, guarding or rebound.     Hernia: There is no hernia in the left inguinal area or right inguinal area.  Genitourinary:    Penis: Normal and uncircumcised.      Testes: Normal. Cremasteric reflex is present.     Epididymis:     Right: Normal.     Left: Normal.  Musculoskeletal:     Cervical back: Normal range of motion.     Comments: Moves all extremities equally and without difficulty.  Skin:  General: Skin is warm and dry.     Capillary Refill: Capillary refill takes less than 2 seconds.  Neurological:     Mental Status: He is alert.     GCS: GCS eye subscore is 4. GCS verbal subscore is 5. GCS motor subscore is 6.     Comments: Speech is clear and goal oriented.  Psychiatric:        Mood and Affect: Mood normal.     ED Results / Procedures / Treatments   Labs (all labs ordered are listed, but only abnormal results are displayed) Labs Reviewed  URINALYSIS, ROUTINE W REFLEX MICROSCOPIC - Abnormal; Notable for the following components:      Result Value   APPearance HAZY (*)    Hgb urine dipstick MODERATE (*)    Protein, ur 30 (*)    Leukocytes,Ua LARGE (*)    RBC / HPF >50 (*)    WBC, UA >50 (*)    Bacteria, UA RARE (*)    All other components within normal limits  URINE CULTURE  GC/CHLAMYDIA PROBE AMP ([place]) NOT AT [facility]    Procedures Procedures (including critical care time)  Medications Ordered in ED Medications  cefTRIAXone (ROCEPHIN) 1 g in sodium chloride 0.9 % 100 mL IVPB (0 g Intravenous Stopped 11/04/19 0504)    ED Course  I have reviewed  the triage vital signs and the nursing notes.  Pertinent labs & imaging results that were available during my care of the patient were reviewed by me and considered in my medical decision making (see chart for details).    MDM Rules/Calculators/A&P                       Presents with symptoms of urinary tract infection.  Records reviewed.  He was seen on 10/16/2019 for urinary tract infection and discharged home with Keflex.  Urine culture from that day showed E. coli and susceptibility to cephalosporins.  6:08 AM UA evidence of urinary tract infection.  Given Rocephin here in the emergency department and will be discharged again with Keflex.  Patient will need close follow-up with urology given recurrent UTIs.  Well-appearing, no vomiting and no fever.  No CVA tenderness.  Highly doubt pyelonephritis.  Discussed reasons to return to the emergency department.  Patient states understanding and is in agreement with the plan.  Final Clinical Impression(s) / ED Diagnoses Final diagnoses:  Acute cystitis without hematuria    Rx / DC Orders ED Discharge Orders         Ordered    cephALEXin (KEFLEX) 500 MG capsule  4 times daily     11/04/19 0605           Dedra Matsuo, Jarrett [place]oho, PA-C 11/04/19 0938    Ezequiel Essex, MD 11/04/19 [phone]

## 2019-11-04 NOTE — ED Notes (Signed)
B/p- PA notified.

## 2019-11-04 NOTE — ED Notes (Addendum)
Attempted IV x 2. Unsuccessful. 

## 2019-11-04 NOTE — Discharge Instructions (Signed)
1. Medications: Keflex, usual home medications 2. Treatment: rest, drink plenty of fluids, take medications as prescribed 3. Follow Up: Please followup with Urology within 1 week; return to the ER for fevers, persistent vomiting, worsening abdominal pain or other concerning symptoms.

## 2019-11-06 LAB — URINE CULTURE: Culture: >=100000 — AB

## 2019-11-07 ENCOUNTER — Telehealth: Payer: Self-pay

## 2019-11-07 NOTE — Telephone Encounter (Signed)
Post ED Visit - Positive Culture Follow-up  Culture report reviewed by antimicrobial stewardship pharmacist: [facility] []  , Pharm.D. []  Enzo Bi, .D., BCPS AQ-ID []  Celedonio Miyamoto, Pharm.D., BCPS []  [address], .D., BCPS []  [place], .D., BCPS, AAHIVP []  Georgina Pillion, Pharm.D., BCPS, AAHIVP []  [address], PharmD, BCPS []  , PharmD, BCPS []  [place], PharmD, BCPS []  [place], PharmD []  , PharmD, BCPS []  Estella Husk, PharmD [facility] []  Lysle Pearl, PharmD []  , PharmD []  Phillips Climes, PharmD []  , Rph []  Agapito Games) , PharmD []  Verlan Friends, PharmD []  , PharmD []  Mervyn Gay, PharmD []  , PharmD []  Vinnie Level, PharmD []  Yolande Jolly, PharmD []  , PharmD []  Len Childs, PharmD   Positive urine culture Treated with Cephalexin, organism sensitive to the same and no further patient follow-up is required at this time.  11/07/2019, 9:54 AM

## 2020-04-26 ENCOUNTER — Ambulatory Visit: Payer: Medicare Other | Attending: Internal Medicine

## 2020-04-26 DIAGNOSIS — Z23 Encounter for immunization: Secondary | ICD-10-CM

## 2020-04-26 NOTE — Progress Notes (Signed)
   [facility]  Name:  Bobby Griffith.    MRN: [id number] DOB: [birth year]  04/26/2020  Mr. Bobby Griffith was observed post Covid-19 immunization for 15 minutes without incident. He was provided with Vaccine Information Sheet and instruction to access the V-Safe system.   Mr. Bobby Griffith was instructed to call 911 with any severe reactions post vaccine: Marland Kitchen Difficulty breathing  . Swelling of face and throat  . A fast heartbeat  . A bad rash all over body  . Dizziness and weakness

## 2021-10-09 ENCOUNTER — Ambulatory Visit: Payer: Medicare Other | Admitting: Nurse Practitioner

## 2021-10-20 NOTE — Progress Notes (Signed)
? ?New Patient Office Visit ? ?Subjective   ? ?Patient ID: Bobby Griffith., male    DOB: 04-02-1951  Age: 71 y.o. MRN: [id number] ? ?CC:  ?Chief Complaint  ?Patient presents with  ? Establish Care  ?  Np. Est care. No main concerns.   ? ? ?HPI ?Bobby Griffith. presents for new patient visit to establish care.  Introduced to Publishing rights manager role and practice setting.  All questions answered.  Discussed provider/patient relationship and expectations. ? ?Since he has been [age], he has started having erectile dysfunction.  He states that he has had difficulty getting an erection.  He is wondering if he can take some medication to help with this. ? ?Depression screen done: ? ? ?  10/21/2021  ?  8:31 AM  ?Depression screen PHQ 2/9  ?Decreased Interest 0  ?Down, Depressed, Hopeless 0  ?PHQ - 2 Score 0  ? ? ?Outpatient Encounter Medications as of 10/21/2021  ?Medication Sig  ? tadalafil (CIALIS) 20 MG tablet Take 0.5-1 tablets (10-20 mg total) by mouth every other day as needed for erectile dysfunction.  ? Tdap (BOOSTRIX) 5-2.5-18.5 LF-MCG/0.5 injection Inject 0.5 mLs into the muscle once for 1 dose.  ? Zoster Vaccine Adjuvanted [facility]) injection Inject 0.5 mLs into the muscle once for 1 dose.  ? acetaminophen (TYLENOL) 325 MG tablet Take 325 mg by mouth 2 (two) times daily as needed for mild pain.  ? [DISCONTINUED] aspirin EC 81 MG EC tablet Take 1 tablet (81 mg total) by mouth daily. (Patient not taking: Reported on 10/16/2019)  ? [DISCONTINUED] atorvastatin (LIPITOR) 40 MG tablet Take 1 tablet (40 mg total) by mouth daily at 6 PM. (Patient not taking: Reported on 10/16/2019)  ? [DISCONTINUED] camphor-menthol (SARNA) lotion Apply topically as needed for itching. (Patient not taking: Reported on 10/16/2019)  ? [DISCONTINUED] cephALEXin (KEFLEX) 500 MG capsule Take 1 capsule (500 mg total) by mouth 4 (four) times daily.  ? [DISCONTINUED] cyclobenzaprine (FLEXERIL) 5 MG tablet Take 1 tablet (5 mg total) by mouth  3 (three) times daily as needed for muscle spasms. (Patient not taking: Reported on 10/16/2019)  ? [DISCONTINUED] diphenhydrAMINE (BENADRYL) 25 MG tablet Take 1 tablet (25 mg total) by mouth every 6 (six) hours as needed for itching. (Patient not taking: Reported on 10/16/2019)  ? [DISCONTINUED] doxycycline (VIBRAMYCIN) 100 MG capsule Take 1 capsule (100 mg total) by mouth 2 (two) times daily. (Patient not taking: Reported on 10/16/2019)  ? [DISCONTINUED] metoprolol tartrate (LOPRESSOR) 25 MG tablet Take 0.5 tablets (12.5 mg total) by mouth 2 (two) times daily. (Patient not taking: Reported on 10/16/2019)  ? [DISCONTINUED] nitroGLYCERIN (NITROSTAT) 0.4 MG SL tablet Place 1 tablet (0.4 mg total) under the tongue every 5 (five) minutes as needed for chest pain (CP or SOB). (Patient not taking: Reported on 10/16/2019)  ? [DISCONTINUED] permethrin (ELIMITE) 5 % cream Apply to entire body from neck down at night. Rinse off in the morning. Repeat in 1 week if symptoms are not resolved. (Patient not taking: Reported on 10/16/2019)  ? ?No facility-administered encounter medications on file as of 10/21/2021.  ? ? ?Past Medical History:  ?Diagnosis Date  ? Deaf, bilateral   ? ? ?Past Surgical History:  ?Procedure Laterality Date  ? ABDOMINAL SURGERY    ? LEFT HEART CATH AND CORONARY ANGIOGRAPHY N/A 02/23/2018  ? Procedure: LEFT HEART CATH AND CORONARY ANGIOGRAPHY;  Surgeon: Kathleene Hazel, MD;  Location: [facility];  Service: Cardiovascular;  Laterality: N/A;  ? ? ?History reviewed. No pertinent family history. ? ?Social History  ? ?Socioeconomic History  ? Marital status: Single  ?  Spouse name: Not on file  ? Number of children: Not on file  ? Years of education: Not on file  ? Highest education level: Not on file  ?Occupational History  ? Not on file  ?Tobacco Use  ? Smoking status: Never  ? Smokeless tobacco: Never  ?Vaping Use  ? Vaping Use: Never used  ?Substance and Sexual Activity  ? Alcohol use: Yes  ?   Comment: socially  ? Drug use: Never  ? Sexual activity: Not on file  ?Other Topics Concern  ? Not on file  ?Social History Narrative  ? ** Merged History Encounter **  ?    ? ?Social Determinants of Health  ? ?Financial Resource Strain: Not on file  ?Food Insecurity: Not on file  ?Transportation Needs: Not on file  ?Physical Activity: Not on file  ?Stress: Not on file  ?Social Connections: Not on file  ?Intimate Partner Violence: Not on file  ? ? ?Review of Systems  ?Constitutional: Negative.   ?HENT:  Positive for hearing loss. Negative for congestion, ear pain and sore throat.   ?Eyes: Negative.   ?Respiratory: Negative.    ?Cardiovascular: Negative.   ?Gastrointestinal:  Positive for heartburn (with spicy foods).  ?Genitourinary: Negative.   ?Musculoskeletal: Negative.   ?Skin: Negative.   ?Neurological: Negative.   ?Psychiatric/Behavioral: Negative.    ? ?  ?Objective   ? ?BP (!) 142/82 (BP Location: Right Arm, Patient Position: Sitting, Cuff Size: Normal)   Pulse 67   Temp 98.3 ?F (36.8 ?C) (Temporal)   Ht 5\' 10"  (1.778 m)   Wt 161 lb 6.4 oz (73.2 kg)   SpO2 97%   BMI 23.16 kg/m?  ? ?Physical Exam ?Vitals and nursing note reviewed.  ?Constitutional:   ?   General: He is not in acute distress. ?   Appearance: Normal appearance.  ?HENT:  ?   Head: Normocephalic and atraumatic.  ?   Right Ear: Tympanic membrane, ear canal and external ear normal.  ?   Left Ear: Tympanic membrane, ear canal and external ear normal.  ?Eyes:  ?   Conjunctiva/sclera: Conjunctivae normal.  ?Cardiovascular:  ?   Rate and Rhythm: Normal rate and regular rhythm.  ?   Pulses: Normal pulses.  ?   Heart sounds: Normal heart sounds.  ?Pulmonary:  ?   Effort: Pulmonary effort is normal.  ?   Breath sounds: Normal breath sounds.  ?Abdominal:  ?   Palpations: Abdomen is soft.  ?   Tenderness: There is no abdominal tenderness.  ?Musculoskeletal:     ?   General: Normal range of motion.  ?   Cervical back: Normal range of motion and neck  supple. No tenderness.  ?   Right lower leg: No edema.  ?   Left lower leg: No edema.  ?Lymphadenopathy:  ?   Cervical: No cervical adenopathy.  ?Skin: ?   General: Skin is warm and dry.  ?Neurological:  ?   General: No focal deficit present.  ?   Mental Status: He is alert and oriented to person, place, and time.  ?Psychiatric:     ?   Mood and Affect: Mood normal.     ?   Behavior: Behavior normal.     ?   Thought Content: Thought content normal.     ?   Judgment:  Judgment normal.  ? ? ?Last CBC ?Lab Results  ?Component Value Date  ? WBC 3.1 (L) 10/21/2021  ? HGB 14.3 10/21/2021  ? HCT 43.1 10/21/2021  ? MCV 95.3 10/21/2021  ? MCH 31.7 10/16/2019  ? RDW 13.9 10/21/2021  ? PLT 224.0 10/21/2021  ? ?Last metabolic panel ?Lab Results  ?Component Value Date  ? GLUCOSE 74 10/21/2021  ? NA 138 10/21/2021  ? K 4.3 10/21/2021  ? CL 106 10/21/2021  ? CO2 28 10/21/2021  ? BUN 17 10/21/2021  ? CREATININE 1.01 10/21/2021  ? GFRNONAA 59 (L) 10/16/2019  ? CALCIUM 9.3 10/21/2021  ? PROT 7.7 10/21/2021  ? ALBUMIN 4.3 10/21/2021  ? BILITOT 0.8 10/21/2021  ? ALKPHOS 68 10/21/2021  ? AST 17 10/21/2021  ? ALT 16 10/21/2021  ? ANIONGAP 11 10/16/2019  ? ?Last lipids ?Lab Results  ?Component Value Date  ? CHOL 179 10/21/2021  ? HDL 42.10 10/21/2021  ? LDLCALC 113 (H) 10/21/2021  ? TRIG 119.0 10/21/2021  ? CHOLHDL 4 10/21/2021  ? ?  ? ?Assessment & Plan:  ? ?Problem List Items Addressed This Visit   ? ?  ? Other  ? Erectile dysfunction - Primary  ?  He has been having difficulty getting an erection with intercourse for the last 2 to 3 years.  We will check CMP, CBC, lipid panel.  He can start Cialis 10-20mg  as needed.  Discussed possible side effects, not to take within 48 hours of each dose.  Follow-up in 3 months. ? ?  ?  ? Relevant Orders  ? Comprehensive metabolic panel (Completed)  ? CBC (Completed)  ? Elevated blood pressure reading  ?  Blood pressure elevated today at 142/82.  He does not have a history of high blood pressure.   Discussed limiting the amount of salt in his diet.  We will check CMP, CBC.  Encouraged him to check his blood pressure at home.  Follow-up in 3 months. ? ?  ?  ? RESOLVED: Need for shingles vaccine  ? Relevant Medi

## 2021-10-21 ENCOUNTER — Ambulatory Visit (INDEPENDENT_AMBULATORY_CARE_PROVIDER_SITE_OTHER): Payer: Medicare Other | Admitting: Nurse Practitioner

## 2021-10-21 ENCOUNTER — Encounter: Payer: Self-pay | Admitting: Nurse Practitioner

## 2021-10-21 VITALS — BP 142/82 | HR 67 | Temp 98.3°F | Ht 70.0 in | Wt 161.4 lb

## 2021-10-21 DIAGNOSIS — Z13220 Encounter for screening for lipoid disorders: Secondary | ICD-10-CM

## 2021-10-21 DIAGNOSIS — Z125 Encounter for screening for malignant neoplasm of prostate: Secondary | ICD-10-CM | POA: Diagnosis not present

## 2021-10-21 DIAGNOSIS — N529 Male erectile dysfunction, unspecified: Secondary | ICD-10-CM

## 2021-10-21 DIAGNOSIS — Z23 Encounter for immunization: Secondary | ICD-10-CM | POA: Insufficient documentation

## 2021-10-21 DIAGNOSIS — R030 Elevated blood-pressure reading, without diagnosis of hypertension: Secondary | ICD-10-CM | POA: Insufficient documentation

## 2021-10-21 DIAGNOSIS — Z136 Encounter for screening for cardiovascular disorders: Secondary | ICD-10-CM | POA: Diagnosis not present

## 2021-10-21 LAB — COMPREHENSIVE METABOLIC PANEL
ALT: 16 U/L (ref 0–53)
AST: 17 U/L (ref 0–37)
Albumin: 4.3 g/dL (ref 3.5–5.2)
Alkaline Phosphatase: 68 U/L (ref 39–117)
BUN: 17 mg/dL (ref 6–23)
CO2: 28 mEq/L (ref 19–32)
Calcium: 9.3 mg/dL (ref 8.4–10.5)
Chloride: 106 mEq/L (ref 96–112)
Creatinine, Ser: 1.01 mg/dL (ref 0.40–1.50)
GFR: 75.31 mL/min (ref >60.00)
Glucose, Bld: 74 mg/dL (ref 70–99)
Potassium: 4.3 mEq/L (ref 3.5–5.1)
Sodium: 138 mEq/L (ref 135–145)
Total Bilirubin: 0.8 mg/dL (ref 0.2–1.2)
Total Protein: 7.7 g/dL (ref 6.0–8.3)

## 2021-10-21 LAB — CBC
HCT: 43.1 % (ref 39.0–52.0)
Hemoglobin: 14.3 g/dL (ref 13.0–17.0)
MCHC: 33.2 g/dL (ref 30.0–36.0)
MCV: 95.3 fl (ref 78.0–100.0)
Platelets: 224 10*3/uL (ref 150.0–400.0)
RBC: 4.52 Mil/uL (ref 4.22–5.81)
RDW: 13.9 % (ref 11.5–15.5)
WBC: 3.1 10*3/uL — ABNORMAL LOW (ref 4.0–10.5)

## 2021-10-21 LAB — LIPID PANEL
Cholesterol: 179 mg/dL (ref 0–200)
HDL: 42.1 mg/dL (ref >39.00)
LDL Cholesterol: 113 mg/dL — ABNORMAL HIGH (ref 0–99)
NonHDL: 137.02
Total CHOL/HDL Ratio: 4
Triglycerides: 119 mg/dL (ref 0.0–149.0)
VLDL: 23.8 mg/dL (ref 0.0–40.0)

## 2021-10-21 MED ORDER — TETANUS-DIPHTH-ACELL PERTUSSIS 5-2.5-18.5 LF-MCG/0.5 IM SUSP: 0.5000 mL | Freq: Once | INTRAMUSCULAR | 0 refills | Status: AC

## 2021-10-21 MED ORDER — TADALAFIL 20 MG PO TABS: 10.0000 mg | ORAL_TABLET | ORAL | 11 refills | Status: DC | PRN

## 2021-10-21 MED ORDER — SHINGRIX 50 MCG/0.5ML IM SUSR: 0.5000 mL | Freq: Once | INTRAMUSCULAR | 0 refills | Status: AC

## 2021-10-21 NOTE — Patient Instructions (Addendum)
It was great to see you! ? ?We are checking your labs today and will let you know the results via mychart/phone.  ? ?Start cialis 0.5 tablet to 1 tablet about 30-45 minutes before intercourse. Take each dose at least 48 hours apart.  ? ?Vaccine Recommendations:  ? ?Tetanus ?Shingrix (2 doses) ?Prevnar 20 ? ?Let's follow-up in 3 months, sooner if you have concerns. ? ?If a referral was placed today, you will be contacted for an appointment. Please note that routine referrals can sometimes take up to 3-4 weeks to process. Please call our office if you haven't heard anything after this time frame. ? ?Take care, ? ?Rodman Pickle, NP ? ?

## 2021-10-21 NOTE — Assessment & Plan Note (Signed)
Blood pressure elevated today at 142/82.  He does not have a history of high blood pressure.  Discussed limiting the amount of salt in his diet.  We will check CMP, CBC.  Encouraged him to check his blood pressure at home.  Follow-up in 3 months. ?

## 2021-10-21 NOTE — Assessment & Plan Note (Signed)
He has been having difficulty getting an erection with intercourse for the last 2 to 3 years.  We will check CMP, CBC, lipid panel.  He can start Cialis 10-20mg  as needed.  Discussed possible side effects, not to take within 48 hours of each dose.  Follow-up in 3 months. ?

## 2021-10-23 ENCOUNTER — Telehealth: Payer: Self-pay | Admitting: Nurse Practitioner

## 2021-10-23 NOTE — Telephone Encounter (Signed)
Pt called and I relayed Lauren's message about labs, he would like a call back to discuss.  ?

## 2021-10-28 NOTE — Progress Notes (Signed)
I have been unable to reach this patient by phone.  A letter of results is being sent to the last known home address. Sw, cma ?

## 2022-01-25 ENCOUNTER — Ambulatory Visit: Payer: Medicare Other | Admitting: Nurse Practitioner

## 2022-01-26 NOTE — Progress Notes (Unsigned)
   Established Patient Office Visit  Subjective   Patient ID: Lamont Tant., male    DOB: Nov 21, 1950  Age: 71 y.o. MRN: [id number]  No chief complaint on file.   HPI  Linus Orn. Is here to follow-up on elevated blood pressure.   {History (Optional):23778}  ROS    Objective:     There were no vitals taken for this visit. {Vitals History (Optional):23777}  Physical Exam   No results found for any visits on 01/27/22.  {Labs (Optional):23779}  The 10-year ASCVD risk score (Arnett DK, et al., 2019) is: 14.3%    Assessment & Plan:   Problem List Items Addressed This Visit   None   No follow-ups on file.    Gerre Scull, NP

## 2022-01-27 ENCOUNTER — Ambulatory Visit (INDEPENDENT_AMBULATORY_CARE_PROVIDER_SITE_OTHER): Payer: Medicare Other | Admitting: Nurse Practitioner

## 2022-01-27 ENCOUNTER — Encounter: Payer: Self-pay | Admitting: Nurse Practitioner

## 2022-01-27 ENCOUNTER — Telehealth: Payer: Self-pay | Admitting: Nurse Practitioner

## 2022-01-27 VITALS — BP 140/80 | HR 67 | Temp 97.4°F | Wt 162.4 lb

## 2022-01-27 DIAGNOSIS — Z1211 Encounter for screening for malignant neoplasm of colon: Secondary | ICD-10-CM | POA: Diagnosis not present

## 2022-01-27 DIAGNOSIS — I10 Essential (primary) hypertension: Secondary | ICD-10-CM | POA: Diagnosis not present

## 2022-01-27 DIAGNOSIS — N529 Male erectile dysfunction, unspecified: Secondary | ICD-10-CM

## 2022-01-27 MED ORDER — TADALAFIL 20 MG PO TABS
10.0000 mg | ORAL_TABLET | ORAL | 11 refills | Status: DC | PRN
Start: 2022-01-27 — End: 2022-02-03

## 2022-01-27 NOTE — Assessment & Plan Note (Signed)
He states that he never got the prescription for [facility] from [facility].  Refill sent to the pharmacy.  Discussed not taking it 2 days in a row.  Follow-up in 6 months.

## 2022-01-27 NOTE — Telephone Encounter (Unsigned)
Pt was prescribed two medications from their visit today 01/27/22. One was for an infection ($514.) and the other was Cialis ($158). These are both too expensive. He would like scripts that are similar to these, but a lot cheaper.   [id number] [place], [place] - [address]  [address] [place] [address]  Phone:  [phone]  Fax:  [phone]  DEA #:  [id number]  Pt @ [phone]

## 2022-01-27 NOTE — Patient Instructions (Addendum)
It was great to see you!  I have re-sent the cialis to your pharmacy. You can take 1/2 to 1 tablet as needed for erectile dysfunction. Do not take this medication 2 days in a row.   Try to limit the amount of salt in your diet. This can help with your blood pressure.   I have placed a referral for your colonoscopy.   I recommend you get your second shingrix vaccine and a prevnar 20 vaccine from your pharmacy.   Let's follow-up in 6 months, sooner if you have concerns.  If a referral was placed today, you will be contacted for an appointment. Please note that routine referrals can sometimes take up to 3-4 weeks to process. Please call our office if you haven't heard anything after this time frame.  Take care,  Rodman Pickle, NP

## 2022-01-27 NOTE — Assessment & Plan Note (Signed)
Chronic, stable.  His blood pressure today is 140/80.  He is not currently taking any medications for this.  With his age, we will hold off on starting any medications today.  Continue limiting salt in his diet and making sure that he is getting exercise.  Follow-up in 6 months.

## 2022-01-28 ENCOUNTER — Telehealth: Payer: Self-pay | Admitting: Nurse Practitioner

## 2022-01-28 NOTE — Telephone Encounter (Signed)
Multiple attempts made to reach pt by phone. VM was left on both home and mobile number. Sw, cma

## 2022-01-28 NOTE — Telephone Encounter (Signed)
Pt stated the pharmacy said his tadalafil was 513.00 and he can't afford that  and also use pharmacy : [id number] - [place], [place] - [address] Phone:  [phone]     Pt stated the med only was suppose to 11.00 . If pt do not answer lvm

## 2022-02-01 NOTE — Telephone Encounter (Signed)
Caller Name: Aikam Vinje Call back phone #: [phone]  Reason for Call: [facility] is saying [facility]. Requested a call back as he is frustrated about this process

## 2022-02-02 NOTE — Telephone Encounter (Signed)
Called and explained in vm that we cannot control prices for [facility], the only other option is to sen it to another pharmacy where medication is cheaper. If [facility] then he will still have to pay high amount.

## 2022-02-03 ENCOUNTER — Telehealth: Payer: Self-pay | Admitting: Nurse Practitioner

## 2022-02-03 MED ORDER — SILDENAFIL CITRATE 25 MG PO TABS: 25.0000 mg | ORAL_TABLET | Freq: Every day | ORAL | 0 refills | Status: DC | PRN

## 2022-02-03 NOTE — Telephone Encounter (Signed)
Caller Name: Rogue Rafalski Call back phone #: [phone]  Reason for Call: Pt would like a call from cna, he did not want to say but it may be regarding the issues he's had fulfilling his medication.

## 2022-02-03 NOTE — Telephone Encounter (Addendum)
Caller Name: Suleiman Call back phone #: [phone]  Reason for Call: Pt called in using sign language interpreter. Pt said he needed Cialis. Advised that [facility] and asked if pt needed sent to another location. Pt expressed that it was too expensive and wanted [place] to find out what the cost is elsewhere. Advised that we cannot determine cost as we do not have access to pharmacy benefit or pharmacy cost information. Pt hung up.  I called [facility] and pharmacist said because pt has Medicare and Medicaid they are not supposed to use Good RX coupons that would reduce price. Out of pocket cost would be $528 for the RX as written.  I checked [facility] and this would be $120 out of pocket with RX as written.  Pt has not been informed. No answer.

## 2022-02-03 NOTE — Addendum Note (Signed)
Addended by: Rodman Pickle A on: 02/03/2022 03:09 PM   Modules accepted: Orders

## 2022-02-04 NOTE — Telephone Encounter (Signed)
See previous tel encounter

## 2022-02-04 NOTE — Telephone Encounter (Signed)
Pt called in again upset stating that he needed to talk to Lauren. I asked if this was regarding medications and cost. It was. He said that it is too expensive. I asked if he spoke to the pharmacy regarding using a GoodRx coupon. He said he didn't. I gave him the information and told him I was advised yesterday by the pharmacist that it is a "case by case" basis. He wrote down the information and said he will call the pharmacy.

## 2022-04-11 ENCOUNTER — Emergency Department (HOSPITAL_COMMUNITY)
Admission: EM | Admit: 2022-04-11 | Discharge: 2022-04-12 | Disposition: A | Discharge status: Home / Self Care | Payer: Medicare Other | Attending: Emergency Medicine | Admitting: Emergency Medicine

## 2022-04-11 ENCOUNTER — Emergency Department (HOSPITAL_COMMUNITY): Payer: Medicare Other

## 2022-04-11 ENCOUNTER — Other Ambulatory Visit: Payer: Self-pay

## 2022-04-11 ENCOUNTER — Encounter (HOSPITAL_COMMUNITY): Payer: Self-pay | Admitting: *Deleted

## 2022-04-11 DIAGNOSIS — F458 Other somatoform disorders: Secondary | ICD-10-CM | POA: Diagnosis not present

## 2022-04-11 DIAGNOSIS — J029 Acute pharyngitis, unspecified: Secondary | ICD-10-CM | POA: Diagnosis not present

## 2022-04-11 DIAGNOSIS — I10 Essential (primary) hypertension: Secondary | ICD-10-CM | POA: Insufficient documentation

## 2022-04-11 DIAGNOSIS — T17920A Food in respiratory tract, part unspecified causing asphyxiation, initial encounter: Secondary | ICD-10-CM | POA: Diagnosis not present

## 2022-04-11 DIAGNOSIS — R09A2 Foreign body sensation, throat: Secondary | ICD-10-CM

## 2022-04-11 DIAGNOSIS — R0989 Other specified symptoms and signs involving the circulatory and respiratory systems: Secondary | ICD-10-CM | POA: Insufficient documentation

## 2022-04-11 DIAGNOSIS — R079 Chest pain, unspecified: Secondary | ICD-10-CM | POA: Insufficient documentation

## 2022-04-11 DIAGNOSIS — R0789 Other chest pain: Secondary | ICD-10-CM | POA: Diagnosis not present

## 2022-04-11 DIAGNOSIS — R059 Cough, unspecified: Secondary | ICD-10-CM | POA: Diagnosis not present

## 2022-04-11 LAB — BASIC METABOLIC PANEL
Anion gap: 7 (ref 5–15)
BUN: 17 mg/dL (ref 8–23)
CO2: 25 mmol/L (ref 22–32)
Calcium: 9.4 mg/dL (ref 8.9–10.3)
Chloride: 105 mmol/L (ref 98–111)
Creatinine, Ser: 1.03 mg/dL (ref 0.61–1.24)
GFR, Estimated: >60 mL/min (ref >60)
Glucose, Bld: 106 mg/dL — ABNORMAL HIGH (ref 70–99)
Potassium: 4.6 mmol/L (ref 3.5–5.1)
Sodium: 137 mmol/L (ref 135–145)

## 2022-04-11 LAB — CBC
HCT: 48.6 % (ref 39.0–52.0)
Hemoglobin: 16 g/dL (ref 13.0–17.0)
MCH: 31.4 pg (ref 26.0–34.0)
MCHC: 32.9 g/dL (ref 30.0–36.0)
MCV: 95.5 fL (ref 80.0–100.0)
Platelets: 219 10*3/uL (ref 150–400)
RBC: 5.09 MIL/uL (ref 4.22–5.81)
RDW: 13.8 % (ref 11.5–15.5)
WBC: 4.1 10*3/uL (ref 4.0–10.5)
nRBC: 0 % (ref 0.0–0.2)

## 2022-04-11 LAB — TROPONIN I (HIGH SENSITIVITY): Troponin I (High Sensitivity): 4 ng/L (ref <18)

## 2022-04-11 MED ORDER — LIDOCAINE VISCOUS HCL 2 % MT SOLN
15.0000 mL | Freq: Once | OROMUCOSAL | Status: AC
  Administered 2022-04-11: 15 mL via ORAL
  Filled 2022-04-11: qty 15

## 2022-04-11 MED ORDER — IOHEXOL 350 MG/ML SOLN
80.0000 mL | Freq: Once | INTRAVENOUS | Status: AC | PRN
  Administered 2022-04-11: 80 mL via INTRAVENOUS

## 2022-04-11 MED ORDER — ALUM & MAG HYDROXIDE-SIMETH 200-200-20 MG/5ML PO SUSP
30.0000 mL | Freq: Once | ORAL | Status: AC
  Administered 2022-04-11: 30 mL via ORAL
  Filled 2022-04-11: qty 30

## 2022-04-11 MED ORDER — GLUCAGON HCL RDNA (DIAGNOSTIC) 1 MG IJ SOLR: 1.0000 mg | Freq: Once | INTRAMUSCULAR | Status: DC

## 2022-04-11 NOTE — ED Notes (Signed)
RN aware of the elevated B/P

## 2022-04-11 NOTE — ED Triage Notes (Signed)
White thick mucus in throat with dry heaves since last night.    PT is DEAF

## 2022-04-11 NOTE — ED Provider Triage Note (Signed)
Emergency Medicine Provider Triage Evaluation Note  Bobby Griffith , a 71 y.o. male  was evaluated in triage.  Pt complains of food bolus.  Patient reports last night he was eating hamburger, fries when all of a sudden he had a sudden sensation of food stuck in his throat.  The patient denies any history of same, denies being seen by GI.  The patient reports that he has not had any nausea or vomiting, he is still able to swallow clear liquids.  The patient states that he is having burping.  Patient legally deaf, sign language interpreter was utilized.  Review of Systems  Positive:  Negative:   Physical Exam  BP (!) 176/104 (BP Location: Left Arm)   Pulse 61   Temp 97.7 F (36.5 C) (Oral)   Resp 18   Ht 5\' 9"  (1.753 m)   Wt 72.6 kg   SpO2 100%   BMI 23.63 kg/m  Gen:   Awake, no distress   Resp:  Normal effort  MSK:   Moves extremities without difficulty  Other:    Medical Decision Making  Medically screening exam initiated at 8:52 AM.  Appropriate orders placed.  Bobby Griffith. was informed that the remainder of the evaluation will be completed by another provider, this initial triage assessment does not replace that evaluation, and the importance of remaining in the ED until their evaluation is complete.     Azucena Cecil, PA-C 04/11/22 [phone]

## 2022-04-11 NOTE — ED Provider Notes (Signed)
[facility]-EMERGENCY DEPT Provider Note   CSN: [id number] Arrival date & time: 04/11/22  3546     History  Chief Complaint  Patient presents with   Sore Throat    Bobby Griffith. is a 71 y.o. male.  The history is provided by the patient. The history is limited by a language barrier. A language interpreter was used.  Sore Throat Associated symptoms include chest pain.  Patient presents for chest pain and globus sensation.  Medical history includes HTN, deafness.  He reports that he was in his normal state of health up until last night.  While eating a cheeseburger, he experienced chest pain that radiated into his anterior neck.  Chest pain was mild, 2/10 in severity.  It has subsided.  He has continued to experience a globus sensation in the area of his suprasternal notch.  He has had gagging.  He has not vomited.  He denies any coughing.  He has had some whitish mucus mixed in with his saliva.  He has been tolerating his secretions and has been able to drink fluids.  His symptoms did improve while in the ED waiting room.  He continues to have a mild globus sensation.       Home Medications Prior to Admission medications   Medication Sig Start Date End Date Taking? Authorizing Provider  acetaminophen (TYLENOL) 325 MG tablet Take 325 mg by mouth 2 (two) times daily as needed for mild pain.    [provider]  sildenafil (VIAGRA) 25 MG tablet Take 1 tablet (25 mg total) by mouth daily as needed for erectile dysfunction. 02/03/22   McElwee, Jake Church, NP      Allergies    Patient has no known allergies.    Review of Systems   Review of Systems  HENT:  Positive for trouble swallowing.   Cardiovascular:  Positive for chest pain.  Musculoskeletal:  Positive for neck pain.  All other systems reviewed and are negative.   Physical Exam Updated Vital Signs BP (!) 180/89 (BP Location: Left Arm)   Pulse 63   Temp 98.6 F (37 C) (Oral)   Resp 18    Ht 5\' 9"  (1.753 m)   Wt 72.6 kg   SpO2 93%   BMI 23.63 kg/m  Physical Exam Vitals and nursing note reviewed.  Constitutional:      General: He is not in acute distress.    Appearance: He is well-developed. He is not ill-appearing, toxic-appearing or diaphoretic.  HENT:     Head: Normocephalic and atraumatic.     Mouth/Throat:     Mouth: Mucous membranes are moist.     Pharynx: Oropharynx is clear. Uvula midline. No pharyngeal swelling, oropharyngeal exudate, posterior oropharyngeal erythema or uvula swelling.     Tonsils: No tonsillar exudate or tonsillar abscesses.  Eyes:     Conjunctiva/sclera: Conjunctivae normal.  Cardiovascular:     Rate and Rhythm: Normal rate and regular rhythm.     Heart sounds: No murmur heard.    No friction rub.  Pulmonary:     Effort: Pulmonary effort is normal. No respiratory distress.     Breath sounds: Normal breath sounds. No wheezing or rales.  Chest:     Chest wall: No tenderness.  Abdominal:     General: There is no distension.     Palpations: Abdomen is soft.     Tenderness: There is no abdominal tenderness.  Musculoskeletal:        General: No  swelling.     Cervical back: Normal range of motion and neck supple.  Skin:    General: Skin is warm and dry.     Capillary Refill: Capillary refill takes less than 2 seconds.  Neurological:     General: No focal deficit present.     Mental Status: He is alert and oriented to person, place, and time.  Psychiatric:        Mood and Affect: Mood normal.        Behavior: Behavior normal.     ED Results / Procedures / Treatments   Labs (all labs ordered are listed, but only abnormal results are displayed) Labs Reviewed  BASIC METABOLIC PANEL - Abnormal; Notable for the following components:      Result Value   Glucose, Bld 106 (*)    All other components within normal limits  CBC  TROPONIN I (HIGH SENSITIVITY)    EKG None  Radiology CT Angio Chest PE W and/or Wo Contrast  Result  Date: 04/11/2022 CLINICAL DATA:  Cough. EXAM: CT ANGIOGRAPHY CHEST WITH CONTRAST TECHNIQUE: Multidetector CT imaging of the chest was performed using the standard protocol during bolus administration of intravenous contrast. Multiplanar CT image reconstructions and MIPs were obtained to evaluate the vascular anatomy. RADIATION DOSE REDUCTION: This exam was performed according to the departmental dose-optimization program which includes automated exposure control, adjustment of the mA and/or kV according to patient size and/or use of iterative reconstruction technique. CONTRAST:  37mL OMNIPAQUE IOHEXOL 350 MG/ML SOLN COMPARISON:  None Available. FINDINGS: Cardiovascular: Satisfactory opacification of the pulmonary arteries to the segmental level. No evidence of pulmonary embolism. Normal heart size. No pericardial effusion. Mediastinum/Nodes: No enlarged mediastinal, hilar, or axillary lymph nodes. Thyroid gland, trachea, and esophagus demonstrate no significant findings. Lungs/Pleura: There is a 3 mm nodule in the left upper lobe image 8/70. There are minimal nonspecific ground-glass opacities in the bilateral lower lobes. There is no pleural effusion or pneumothorax. Upper Abdomen: No acute abnormality. Musculoskeletal: No chest wall abnormality. No acute or significant osseous findings. Review of the MIP images confirms the above findings. IMPRESSION: 1. No evidence for pulmonary embolism. 2. Minimal nonspecific ground-glass opacities in the lower lobes, possibly infectious/inflammatory. 3. 3 mm left solid pulmonary nodule within the upper lobe. Per Fleischner Society Guidelines, if patient is low risk for malignancy, no routine follow-up imaging is recommended. If patient is high risk for malignancy, a non-contrast Chest CT at 12 months is optional. If performed and the nodule is stable at 12 months, no further follow-up is recommended. These guidelines do not apply to immunocompromised patients and patients  with cancer. Follow up in patients with significant comorbidities as clinically warranted. For lung cancer screening, adhere to Lung-RADS guidelines. Reference: Radiology. 2017; 284(1):228-43. Electronically Signed   By: Darliss Cheney M.D.   On: 04/11/2022 21:28   CT Soft Tissue Neck Wo Contrast  Result Date: 04/11/2022 CLINICAL DATA:  Cough and food bolus sensation EXAM: CT NECK WITHOUT CONTRAST TECHNIQUE: Multidetector CT imaging of the neck was performed following the standard protocol without intravenous contrast. RADIATION DOSE REDUCTION: This exam was performed according to the departmental dose-optimization program which includes automated exposure control, adjustment of the mA and/or kV according to patient size and/or use of iterative reconstruction technique. COMPARISON:  None Available. FINDINGS: Examination extends from the level of the soft palate to the thoracic inlet. Pharynx and larynx: Visualized portion is normal. Salivary glands: No inflammation, mass, or stone. Thyroid: Normal. Lymph nodes: None enlarged  or abnormal density. Vascular: Negative. Limited intracranial: Negative. Visualized orbits: Negative. Mastoids and visualized paranasal sinuses: Clear. Skeleton: No acute or aggressive process. Upper chest: Negative. Other: None. IMPRESSION: 1. Examination extends from the level of the soft palate to the thoracic inlet. No foreign body visualized in the aero digestive tract. Electronically Signed   By: Deatra Robinson M.D.   On: 04/11/2022 21:24   DG Neck Soft Tissue  Result Date: 04/11/2022 CLINICAL DATA:  Food bolus EXAM: NECK SOFT TISSUES - 1+ VIEW COMPARISON:  None Available. FINDINGS: There is no evidence of retropharyngeal soft tissue swelling or epiglottic enlargement. The cervical airway is unremarkable and no radio-opaque foreign body identified. Mild-to-moderate degenerative disc disease within the lower cervical spine. IMPRESSION: No abnormal prevertebral soft tissue swelling  or radiopaque foreign body identified. Electronically Signed   By: Duanne Guess D.O.   On: 04/11/2022 10:07    Procedures Procedures    Medications Ordered in ED Medications  alum & mag hydroxide-simeth (MAALOX/MYLANTA) 200-200-20 MG/5ML suspension 30 mL (30 mLs Oral Given 04/11/22 2013)    And  lidocaine (XYLOCAINE) 2 % viscous mouth solution 15 mL (15 mLs Oral Given 04/11/22 2013)  iohexol (OMNIPAQUE) 350 MG/ML injection 80 mL (80 mLs Intravenous Contrast Given 04/11/22 2103)    ED Course/ Medical Decision Making/ A&P                           Medical Decision Making Amount and/or Complexity of Data Reviewed Radiology: ordered.  Risk OTC drugs. Prescription drug management.   This patient presents to the ED for concern of chest pain and globus sensation, this involves an extensive number of treatment options, and is a complaint that carries with it a high risk of complications and morbidity.  The differential diagnosis includes food bolus, ACS, mediastinitis, Zenker diverticulum   Co morbidities that complicate the patient evaluation  HTN, deafness   Additional history obtained:  Additional history obtained from N/A External records from outside source obtained and reviewed including EMR   Lab Tests:  I Ordered, and personally interpreted labs.  The pertinent results include: Normal electrolytes, normal kidney function, normal troponin, no leukocytosis   Imaging Studies ordered:  I ordered imaging studies including CT neck, CTA chest I independently visualized and interpreted imaging which showed minimal bibasilar groundglass opacities. I agree with the radiologist interpretation   Cardiac Monitoring: / EKG:  The patient was maintained on a cardiac monitor.  I personally viewed and interpreted the cardiac monitored which showed an underlying rhythm of: Sinus rhythm   Problem List / ED Course / Critical interventions / Medication management  Patient is  a 71 year old male who presents for a episode of chest pain and gagging.  Onset was last night.  Although his chest pain has improved, he does continue to have a mild globus sensation in the suprasternal notch area.  He has been tolerating his secretions and has been able to tolerate liquids.  Vital signs on arrival are notable for hypertension.  Patient states that he has no history of hypertension but that this is documented in EMR.  Prior to being bedded in the ED, lab work was obtained and results are unremarkable.  Patient underwent CT of neck and CTA chest.  Troponin was ordered.  Results of imaging studies were also reassuring.  Although he does have some mild bibasilar crackles opacities, he has not had any respiratory symptoms.  I do not suspect pneumonia.  While in the ED, patient was given GI cocktail.  He reports that this initially caused some irritation but he was able to tolerate it and it did have improved symptoms shortly thereafter.  He was given water and crackers which she was also able to tolerate well.  I do not suspect food bolus.  His troponin was normal.  Given that this was drawn approximately 24 hours after onset of his symptoms, I do not suspect a cardiac event.  Patient, at this time, does feel comfortable with discharge home.  He was advised to follow-up with his primary care doctor and to return to the ED for any new or worsening symptoms of concern.  He was discharged in stable condition. I ordered medication including GI cocktail for reflux symptoms Reevaluation of the patient after these medicines showed that the patient improved I have reviewed the patients home medicines and have made adjustments as needed   Social Determinants of Health:  Has access to outpatient care         Final Clinical Impression(s) / ED Diagnoses Final diagnoses:  Globus sensation    Rx / DC Orders ED Discharge Orders     None         Godfrey Pick, MD 04/11/22 2339

## 2022-04-13 ENCOUNTER — Telehealth: Payer: Self-pay

## 2022-04-13 NOTE — Telephone Encounter (Signed)
Transition Care Management Unsuccessful Follow-up Telephone Call  Date of discharge and from where:  04/12/22 [facility] ED  Attempts:  1st Attempt  Reason for unsuccessful TCM follow-up call:  Unable to reach patient  No answer from patient. Called Danny, EC, he was unable to verify patient's DOB. Danny asked to have patient call the office.

## 2022-04-16 NOTE — Telephone Encounter (Signed)
Transition Care Management Unsuccessful Follow-up Telephone Call  Date of discharge and from where:  04/12/22 [facility] ED  Attempts:  2nd Attempt  Reason for unsuccessful TCM follow-up call:  Unable to reach patient

## 2022-04-20 ENCOUNTER — Ambulatory Visit (INDEPENDENT_AMBULATORY_CARE_PROVIDER_SITE_OTHER): Payer: Medicare Other | Admitting: Family Medicine

## 2022-04-20 ENCOUNTER — Encounter: Payer: Self-pay | Admitting: Family Medicine

## 2022-04-20 VITALS — BP 118/76 | HR 70 | Temp 97.5°F | Wt 158.2 lb

## 2022-04-20 DIAGNOSIS — R09A2 Foreign body sensation, throat: Secondary | ICD-10-CM

## 2022-04-20 DIAGNOSIS — Z23 Encounter for immunization: Secondary | ICD-10-CM | POA: Diagnosis not present

## 2022-04-20 DIAGNOSIS — R1111 Vomiting without nausea: Secondary | ICD-10-CM

## 2022-04-20 DIAGNOSIS — K219 Gastro-esophageal reflux disease without esophagitis: Secondary | ICD-10-CM | POA: Diagnosis not present

## 2022-04-20 MED ORDER — PANTOPRAZOLE SODIUM 40 MG PO TBEC: 40.0000 mg | DELAYED_RELEASE_TABLET | Freq: Every day | ORAL | 3 refills | Status: DC

## 2022-04-20 NOTE — Progress Notes (Signed)
Assessment/Plan:   Problem List Items Addressed This Visit   None Visit Diagnoses     Gastroesophageal reflux disease without esophagitis    -  Primary   Relevant Medications   pantoprazole (PROTONIX) 40 MG tablet   Other Relevant Orders   H. pylori breath test   Ambulatory referral to Gastroenterology   Vomiting without nausea, unspecified vomiting type       Relevant Orders   Ambulatory referral to Gastroenterology   Globus pharyngeus       Relevant Medications   pantoprazole (PROTONIX) 40 MG tablet   Other Relevant Orders   Ambulatory referral to Gastroenterology   Needs flu shot       Relevant Orders   Flu Vaccine QUAD High Dose(Fluad) (Completed)      Sudden onset GERD symptoms with globus versus dysphagia Concern for possible esophageal ulcer versus although none seen on recent CT  symptoms are improved with Protonix We will prescribe Protonix H. pylori testing Referral to gastroenterology    Subjective:  HPI:  Bobby Griffith. is a 71 y.o. male who has Erectile dysfunction and Primary hypertension on their problem list..   He  has a past medical history of Deaf, bilateral..   He presents with chief complaint of Acid reflux (Since Friday has had issues with  acid reflux  Requests pantoprazole.) .   GERD, Follow up:  The patient was last seen for GERD 2 weeks ago. Went to ED for globus sensation and chest pain. Had cardiac work up with EKG w/o STEMI, negative troponin, CT Neck and CT Chest without signs of food bolus, PE, or other abnormalities. Was given GI cocktail with improvement in symptoms.  Denies history of GERD. Patient tried pantoprazole from a friend and this did improve symptoms.  He is still having ongoing heart burn, and belching, globus sensation in throat.  Also has daily gagging and mild vomiting 1-3 times a day.  No hematemesis.  Says that he is tolerating p.o. solids and fluids well.      -----------------------------------------------------------------------------------------   Past Surgical History:  Procedure Laterality Date   ABDOMINAL SURGERY     LEFT HEART CATH AND CORONARY ANGIOGRAPHY N/A 02/23/2018   Procedure: LEFT HEART CATH AND CORONARY ANGIOGRAPHY;  Surgeon: Burnell Blanks, MD;  Location: [place] [facility];  Service: Cardiovascular;  Laterality: N/A;    Outpatient Medications Prior to Visit  Medication Sig Dispense Refill   acetaminophen (TYLENOL) 325 MG tablet Take 325 mg by mouth 2 (two) times daily as needed for mild pain. (Patient not taking: Reported on 04/20/2022)     sildenafil (VIAGRA) 25 MG tablet Take 1 tablet (25 mg total) by mouth daily as needed for erectile dysfunction. (Patient not taking: Reported on 04/20/2022) 10 tablet 0   No facility-administered medications prior to visit.    No family history on file.  Social History   Socioeconomic History   Marital status: Single    Spouse name: Not on file   Number of children: Not on file   Years of education: Not on file   Highest education level: Not on file  Occupational History   Not on file  Tobacco Use   Smoking status: Never   Smokeless tobacco: Never  Vaping Use   Vaping Use: Never used  Substance and Sexual Activity   Alcohol use: Yes    Comment: socially   Drug use: Never   Sexual activity: Not on file  Other Topics Concern  Not on file  Social History Narrative   ** Merged History Encounter **       Social Determinants of Health   Financial Resource Strain: Not on file  Food Insecurity: Not on file  Transportation Needs: Not on file  Physical Activity: Not on file  Stress: Not on file  Social Connections: Not on file  Intimate Partner Violence: Not on file                                                                                                 Objective:  Physical Exam: BP 118/76 (BP Location: Left Arm, Patient Position: Sitting, Cuff  Size: Large)   Pulse 70   Temp (!) 97.5 F (36.4 C) (Temporal)   Wt 158 lb 3.2 oz (71.8 kg)   SpO2 98%   BMI 23.36 kg/m    General: No acute distress. Awake and conversant.  Eyes: Normal conjunctiva, anicteric. Round symmetric pupils.  ENT: Hearing grossly intact. No nasal discharge.  No oropharyngeal lesions Neck: Neck is supple. No masses or thyromegaly.  Respiratory: Respirations are non-labored. No auditory wheezing.  CTA B Skin: Warm. No rashes or ulcers.  ABD: Nontender nondistended Psych: Alert and oriented. Cooperative, Appropriate mood and affect, Normal judgment.  CV: No cyanosis or JVD, RRR, no MRG MSK: Normal ambulation. No clubbing  Neuro: Sensation and CN II-XII grossly normal.        Alesia Banda, MD, MS

## 2022-04-20 NOTE — Patient Instructions (Signed)
Start Protonix.  We are referring to Gastroenterology.  We will call you with H pylori infection results.

## 2022-04-20 NOTE — Telephone Encounter (Signed)
Transition Care Management Follow-up Telephone Call Date of discharge and from where: 04/12/22 [facility] ED.Dx: Globus Sensation/Sore Throat How have you been since you were released from the hospital? Im doing good. Any questions or concerns? No  Items Reviewed: Did the pt receive and understand the discharge instructions provided? Yes  Medications obtained and verified? No  Other? No  Any new allergies since your discharge? No  Dietary orders reviewed? Yes Do you have support at home? Yes   Home Care and Equipment/Supplies: Were home health services ordered? not applicable If so, what is the name of the agency? N/a  Has the agency set up a time to come to the patient's home? not applicable Were any new equipment or medical supplies ordered?  No What is the name of the medical supply agency? N/a Were you able to get the supplies/equipment? not applicable Do you have any questions related to the use of the equipment or supplies? No  Functional Questionnaire: (I = Independent and D = Dependent) ADLs: I  Bathing/Dressing- I  Meal Prep- I  Eating- I  Maintaining continence- I  Transferring/Ambulation- I  Managing Meds- I  Follow up appointments reviewed:  PCP Hospital f/u appt confirmed? Yes  Scheduled to see Dr. Grandville Silos [facility] patient) on 04/20/22 @ 3:00pm. [facility] f/u appt confirmed? No  Scheduled to see n/a on n/a @ n/a. Are transportation arrangements needed? No  If their condition worsens, is the pt aware to call PCP or go to the Emergency Dept.? Yes Was the patient provided with contact information for the PCP's office or ED? Yes Was to pt encouraged to call back with questions or concerns? Yes  Angeline Slim, RN, BSN RN Clinical Supervisor [facility] [employer]

## 2022-04-23 LAB — H. PYLORI BREATH TEST

## 2022-04-26 ENCOUNTER — Telehealth: Payer: Self-pay

## 2022-04-26 NOTE — Telephone Encounter (Signed)
Got a fax from [facility] stating there was not enough CO2 in one of the bags  He will need to repeat this tests

## 2022-04-26 NOTE — Telephone Encounter (Signed)
error 

## 2022-05-05 ENCOUNTER — Encounter: Payer: Self-pay | Admitting: Family Medicine

## 2022-05-10 ENCOUNTER — Encounter: Payer: Self-pay | Admitting: Family Medicine

## 2022-06-10 ENCOUNTER — Emergency Department (HOSPITAL_COMMUNITY): Payer: Medicare Other

## 2022-06-10 ENCOUNTER — Emergency Department (HOSPITAL_COMMUNITY)
Admission: EM | Admit: 2022-06-10 | Discharge: 2022-06-11 | Disposition: A | Discharge status: Home / Self Care | Payer: Medicare Other | Attending: Emergency Medicine | Admitting: Emergency Medicine

## 2022-06-10 ENCOUNTER — Other Ambulatory Visit: Payer: Self-pay

## 2022-06-10 DIAGNOSIS — R0981 Nasal congestion: Secondary | ICD-10-CM

## 2022-06-10 DIAGNOSIS — J069 Acute upper respiratory infection, unspecified: Secondary | ICD-10-CM | POA: Diagnosis not present

## 2022-06-10 DIAGNOSIS — R0789 Other chest pain: Secondary | ICD-10-CM | POA: Diagnosis not present

## 2022-06-10 DIAGNOSIS — G4489 Other headache syndrome: Secondary | ICD-10-CM | POA: Diagnosis not present

## 2022-06-10 DIAGNOSIS — Z20822 Contact with and (suspected) exposure to covid-19: Secondary | ICD-10-CM | POA: Insufficient documentation

## 2022-06-10 DIAGNOSIS — J9811 Atelectasis: Secondary | ICD-10-CM | POA: Diagnosis not present

## 2022-06-10 DIAGNOSIS — R051 Acute cough: Secondary | ICD-10-CM

## 2022-06-10 DIAGNOSIS — Z79899 Other long term (current) drug therapy: Secondary | ICD-10-CM | POA: Diagnosis not present

## 2022-06-10 DIAGNOSIS — I10 Essential (primary) hypertension: Secondary | ICD-10-CM | POA: Insufficient documentation

## 2022-06-10 DIAGNOSIS — R059 Cough, unspecified: Secondary | ICD-10-CM | POA: Diagnosis not present

## 2022-06-10 DIAGNOSIS — R509 Fever, unspecified: Secondary | ICD-10-CM | POA: Insufficient documentation

## 2022-06-10 DIAGNOSIS — R42 Dizziness and giddiness: Secondary | ICD-10-CM | POA: Diagnosis not present

## 2022-06-10 DIAGNOSIS — R519 Headache, unspecified: Secondary | ICD-10-CM | POA: Diagnosis not present

## 2022-06-10 LAB — CBC WITH DIFFERENTIAL/PLATELET
Abs Immature Granulocytes: 0.01 10*3/uL (ref 0.00–0.07)
Basophils Absolute: 0 10*3/uL (ref 0.0–0.1)
Basophils Relative: 0 %
Eosinophils Absolute: 0 10*3/uL (ref 0.0–0.5)
Eosinophils Relative: 0 %
HCT: 43.8 % (ref 39.0–52.0)
Hemoglobin: 14.5 g/dL (ref 13.0–17.0)
Immature Granulocytes: 0 %
Lymphocytes Relative: 13 %
Lymphs Abs: 0.7 10*3/uL (ref 0.7–4.0)
MCH: 31.5 pg (ref 26.0–34.0)
MCHC: 33.1 g/dL (ref 30.0–36.0)
MCV: 95.2 fL (ref 80.0–100.0)
Monocytes Absolute: 0.4 10*3/uL (ref 0.1–1.0)
Monocytes Relative: 7 %
Neutro Abs: 4.1 10*3/uL (ref 1.7–7.7)
Neutrophils Relative %: 80 %
Platelets: 199 10*3/uL (ref 150–400)
RBC: 4.6 MIL/uL (ref 4.22–5.81)
RDW: 13.2 % (ref 11.5–15.5)
WBC: 5.2 10*3/uL (ref 4.0–10.5)
nRBC: 0 % (ref 0.0–0.2)

## 2022-06-10 LAB — BASIC METABOLIC PANEL
Anion gap: 9 (ref 5–15)
BUN: 11 mg/dL (ref 8–23)
CO2: 23 mmol/L (ref 22–32)
Calcium: 8.8 mg/dL — ABNORMAL LOW (ref 8.9–10.3)
Chloride: 102 mmol/L (ref 98–111)
Creatinine, Ser: 0.96 mg/dL (ref 0.61–1.24)
GFR, Estimated: >60 mL/min (ref >60)
Glucose, Bld: 140 mg/dL — ABNORMAL HIGH (ref 70–99)
Potassium: 3.8 mmol/L (ref 3.5–5.1)
Sodium: 134 mmol/L — ABNORMAL LOW (ref 135–145)

## 2022-06-10 NOTE — ED Provider Triage Note (Signed)
Emergency Medicine Provider Triage Evaluation Note  Bobby Griffith , a 71 y.o. male  was evaluated in triage.  Pt complains of dizziness. Symptoms persistent all day. Started two hours after taking a "DM" medication this morning for cough. Took same medication last night w/o issue. Denies vision changes or loss, CP, SOB, vomiting, fevers. No hx of HTN, per patient. Takes no daily meds.  Review of Systems  Positive: As above Negative: As above  Physical Exam  BP (!) 150/122 (BP Location: Right Arm)   Pulse 77   Temp 98.1 F (36.7 C)   Resp 18   SpO2 95%  Gen:   Awake, no distress   Resp:  Normal effort  MSK:   Moves extremities without difficulty   Medical Decision Making  Medically screening exam initiated at 10:00 PM.  Appropriate orders placed.  Bobby Griffith. was informed that the remainder of the evaluation will be completed by another provider, this initial triage assessment does not replace that evaluation, and the importance of remaining in the ED until their evaluation is complete.  Dizziness, unspecified. Onset after taking a cough/cold medicine. Hypertensive in triage.   Antony Madura, PA-C 06/10/22 2202

## 2022-06-10 NOTE — ED Triage Notes (Addendum)
Patient arrived with EMS from home , he is deaf , reports dizziness/lightheaded and productive cough for 3 days .

## 2022-06-11 ENCOUNTER — Emergency Department (HOSPITAL_COMMUNITY): Payer: Medicare Other

## 2022-06-11 ENCOUNTER — Emergency Department (HOSPITAL_COMMUNITY)
Admission: EM | Admit: 2022-06-11 | Discharge: 2022-06-11 | Disposition: A | Discharge status: Home / Self Care | Payer: Medicare Other | Source: Home / Self Care | Attending: Emergency Medicine | Admitting: Emergency Medicine

## 2022-06-11 ENCOUNTER — Encounter (HOSPITAL_COMMUNITY): Payer: Self-pay

## 2022-06-11 ENCOUNTER — Other Ambulatory Visit: Payer: Self-pay

## 2022-06-11 DIAGNOSIS — J9811 Atelectasis: Secondary | ICD-10-CM | POA: Diagnosis not present

## 2022-06-11 DIAGNOSIS — R0789 Other chest pain: Secondary | ICD-10-CM | POA: Diagnosis not present

## 2022-06-11 DIAGNOSIS — R519 Headache, unspecified: Secondary | ICD-10-CM | POA: Diagnosis not present

## 2022-06-11 DIAGNOSIS — I10 Essential (primary) hypertension: Secondary | ICD-10-CM | POA: Insufficient documentation

## 2022-06-11 DIAGNOSIS — Z79899 Other long term (current) drug therapy: Secondary | ICD-10-CM | POA: Insufficient documentation

## 2022-06-11 DIAGNOSIS — J069 Acute upper respiratory infection, unspecified: Secondary | ICD-10-CM

## 2022-06-11 LAB — BASIC METABOLIC PANEL
Anion gap: 10 (ref 5–15)
BUN: 10 mg/dL (ref 8–23)
CO2: 23 mmol/L (ref 22–32)
Calcium: 9.1 mg/dL (ref 8.9–10.3)
Chloride: 101 mmol/L (ref 98–111)
Creatinine, Ser: 1.11 mg/dL (ref 0.61–1.24)
GFR, Estimated: >60 mL/min (ref >60)
Glucose, Bld: 118 mg/dL — ABNORMAL HIGH (ref 70–99)
Potassium: 4.2 mmol/L (ref 3.5–5.1)
Sodium: 134 mmol/L — ABNORMAL LOW (ref 135–145)

## 2022-06-11 LAB — CBC
HCT: 43.6 % (ref 39.0–52.0)
Hemoglobin: 15 g/dL (ref 13.0–17.0)
MCH: 31.8 pg (ref 26.0–34.0)
MCHC: 34.4 g/dL (ref 30.0–36.0)
MCV: 92.4 fL (ref 80.0–100.0)
Platelets: 216 10*3/uL (ref 150–400)
RBC: 4.72 MIL/uL (ref 4.22–5.81)
RDW: 13 % (ref 11.5–15.5)
WBC: 3.6 10*3/uL — ABNORMAL LOW (ref 4.0–10.5)
nRBC: 0 % (ref 0.0–0.2)

## 2022-06-11 LAB — RESP PANEL BY RT-PCR (RSV, FLU A&B, COVID)  RVPGX2
Influenza A by PCR: NEGATIVE
Influenza B by PCR: NEGATIVE
Resp Syncytial Virus by PCR: NEGATIVE
SARS Coronavirus 2 by RT PCR: NEGATIVE

## 2022-06-11 LAB — TROPONIN I (HIGH SENSITIVITY)
Troponin I (High Sensitivity): 6 ng/L (ref <18)
Troponin I (High Sensitivity): 8 ng/L (ref <18)

## 2022-06-11 MED ORDER — ONDANSETRON 4 MG PO TBDP: 4.0000 mg | ORAL_TABLET | Freq: Once | ORAL | Status: DC

## 2022-06-11 MED ORDER — LACTATED RINGERS IV BOLUS
500.0000 mL | Freq: Once | INTRAVENOUS | Status: AC
  Administered 2022-06-11: 500 mL via INTRAVENOUS

## 2022-06-11 MED ORDER — ONDANSETRON HCL 4 MG/2ML IJ SOLN
4.0000 mg | Freq: Once | INTRAMUSCULAR | Status: AC
  Administered 2022-06-11: 4 mg via INTRAVENOUS
  Filled 2022-06-11: qty 2

## 2022-06-11 MED ORDER — ACETAMINOPHEN 500 MG PO TABS
1000.0000 mg | ORAL_TABLET | Freq: Once | ORAL | Status: AC
  Administered 2022-06-11: 1000 mg via ORAL
  Filled 2022-06-11: qty 2

## 2022-06-11 NOTE — ED Notes (Signed)
Discharge instructions reviewed with patient. Patient denies any questions or concerns. ASL interpreter used for discharge. Patient ambulatory out of ED.

## 2022-06-11 NOTE — Discharge Instructions (Signed)
Please use acetaminophen 650 mg up to every 4 hours for pain or fever. Please recheck with your doctor in the next 3 to 5 days and have your blood pressure rechecked as an outpatient. Please return to the emergency department if you are having fever that you cannot control with medication, shortness of breath or chest pain.

## 2022-06-11 NOTE — ED Notes (Signed)
RN called CT to notify that pt is deaf and will not hear his name called. She added that on to his paper.

## 2022-06-11 NOTE — ED Triage Notes (Signed)
Pt arrived POV after being discharged at 1030 this morning stating since he left he has had a headache and his BP is elevated.

## 2022-06-11 NOTE — ED Provider Notes (Signed)
[facility] EMERGENCY DEPARTMENT Provider Note   CSN: [id number] Arrival date & time: 06/10/22  2115     History  Chief Complaint  Patient presents with   Dizzy/Cough    Bobby Griffith. is a 71 y.o. male with past medical history significant for HTN, deafness, CAD s/p LHC in 2019 (medical management), presenting with cough, congestion, subjective fever, dizziness for 2 days.  He states he has had cough and congestion.  He took dextromethorphan and felt dizzy after taking this.  He states the cough has been productive.  He has felt feverish and has not been able to take his temperature.  He has not taken medications other than the dextromethorphan.  He endorses diarrhea.  He endorses nausea intermittently, denies vomiting.  He states he has had congestion, sore throat.  He endorses headache which he describes as tightness like a band around his head.  He states he has not been able to eat over the past day due to feeling poorly.  [employer] ASL interpreter, Mel, facilitated this visit.     Home Medications Prior to Admission medications   Medication Sig Start Date End Date Taking? Authorizing Provider  acetaminophen (TYLENOL) 325 MG tablet Take 325 mg by mouth 2 (two) times daily as needed for mild pain. Patient not taking: Reported on 04/20/2022    [provider]  pantoprazole (PROTONIX) 40 MG tablet Take 1 tablet (40 mg total) by mouth daily. 04/20/22   Garnette Gunner, MD  sildenafil (VIAGRA) 25 MG tablet Take 1 tablet (25 mg total) by mouth daily as needed for erectile dysfunction. Patient not taking: Reported on 04/20/2022 02/03/22   Gerre Scull, NP      Allergies    Patient has no known allergies.    Review of Systems   Review of Systems  Physical Exam Updated Vital Signs BP (!) 144/93 (BP Location: Left Arm)   Pulse 95   Temp (!) 97.5 F (36.4 C) (Oral)   Resp 18   SpO2 97%  Physical Exam HENT:     Head: Normocephalic and  atraumatic.     Nose: Congestion present.     Mouth/Throat:     Mouth: Mucous membranes are moist.     Pharynx: Posterior oropharyngeal erythema present. No oropharyngeal exudate.  Eyes:     Extraocular Movements: Extraocular movements intact.     Conjunctiva/sclera: Conjunctivae normal.     Pupils: Pupils are equal, round, and reactive to light.  Cardiovascular:     Rate and Rhythm: Normal rate and regular rhythm.  Pulmonary:     Effort: Pulmonary effort is normal.     Breath sounds: Normal breath sounds.     Comments: Intermittent cough throughout evaluation Abdominal:     General: There is no distension.     Tenderness: There is no abdominal tenderness. There is no guarding or rebound.  Musculoskeletal:     Cervical back: Normal range of motion. No tenderness.     Right lower leg: No edema.     Left lower leg: No edema.  Skin:    General: Skin is warm and dry.  Neurological:     Mental Status: He is oriented to person, place, and time.     Comments: PERRL, EOMI Palate elevates symmetrically, tongue is midline without fasciculations No facial asymmetry, sensation intact on face Strength intact and grossly equal in upper and lower extremities bilaterally.  Sensation to light touch intact and grossly equal in upper and  lower extremities bilaterally No pronator drift Gait intact, normal gait, not ataxic     ED Results / Procedures / Treatments   Labs (all labs ordered are listed, but only abnormal results are displayed) Labs Reviewed  BASIC METABOLIC PANEL - Abnormal; Notable for the following components:      Result Value   Sodium 134 (*)    Glucose, Bld 140 (*)    Calcium 8.8 (*)    All other components within normal limits  CBC WITH DIFFERENTIAL/PLATELET    EKG None  Radiology DG Chest 2 View  Result Date: 06/10/2022 CLINICAL DATA:  Cough EXAM: CHEST - 2 VIEW COMPARISON:  02/21/2018 FINDINGS: The heart size and mediastinal contours are within normal limits.  Both lungs are clear. The visualized skeletal structures are unremarkable. IMPRESSION: No active cardiopulmonary disease. Electronically Signed   By: Deatra Robinson M.D.   On: 06/10/2022 22:27    Procedures Procedures    Medications Ordered in ED Medications - No data to display  ED Course/ Medical Decision Making/ A&P                           Medical Decision Making Amount and/or Complexity of Data Reviewed Labs: ordered. Decision-making details documented in ED Course. Radiology: ordered and independent interpretation performed. Decision-making details documented in ED Course. ECG/medicine tests: ordered and independent interpretation performed. Decision-making details documented in ED Course.  Risk OTC drugs. Prescription drug management.   Patient presents as above.  Differential diagnosis includes but is not limited to viral URI, medication reaction, dehydration.  Chest x-ray obtained in triage showed no focal consolidation or opacification, no pneumothorax.  Patient has a nonfocal neurologic exam.  His gait is intact.  No pronator drift and normal coordination.  I do not think this is CVA.  Patient is afebrile here, normal ROM of neck, no neck tenderness, symptoms more consistent with viral respiratory function, I do not think this is meningitis.  COVID/flu/RSV swab ordered.  Tylenol given for head tightness.  Zofran ordered for nausea.  Small fluid bolus ordered.  On chart review, patient has normal EF on echo 2019.  On reevaluation, patient was comfortable appearing, able to ambulate without difficulty.  I discussed his results with him.  He is stable for discharge at this time.  Strict return precautions given.  Symptomatic management discussed.  I encouraged him to discontinue dextromethorphan and to follow-up with his PCP.         Final Clinical Impression(s) / ED Diagnoses Final diagnoses:  None    Rx / DC Orders ED Discharge Orders     None          Linward Foster, MD 06/11/22 1736    Gerhard Munch, MD 06/12/22 1355

## 2022-06-11 NOTE — ED Provider Notes (Signed)
[facility] EMERGENCY DEPARTMENT Provider Note   CSN: [id number] Arrival date & time: 06/11/22  1212     History  Chief Complaint  Patient presents with   Headache    Bobby Griffith. is a 71 y.o. male.  HPI Provide caveat secondary to patient is deaf and used American sign language interpreter 71 year old male who was seen here earlier today for similar complaints.  He has had some congestion and cough for 24 hours.  He has also had some headache.  He is taken cough and cold medicine.  He was noted to be somewhat hypertensive earlier when he was seen.  He was seen and felt better prior to discharge.  He states that he went home and had more of a headache and returns secondary to this.  Prior to my evaluation he was evaluated again with labs, troponin, head CT, chest x-Joselyn Edling, and EKG     Home Medications Prior to Admission medications   Medication Sig Start Date End Date Taking? Authorizing Provider  acetaminophen (TYLENOL) 325 MG tablet Take 325 mg by mouth 2 (two) times daily as needed for mild pain. Patient not taking: Reported on 04/20/2022    [provider]  pantoprazole (PROTONIX) 40 MG tablet Take 1 tablet (40 mg total) by mouth daily. 04/20/22   Bonnita Hollow, MD  sildenafil (VIAGRA) 25 MG tablet Take 1 tablet (25 mg total) by mouth daily as needed for erectile dysfunction. Patient not taking: Reported on 04/20/2022 02/03/22   Charyl Dancer, NP      Allergies    Patient has no known allergies.    Review of Systems   Review of Systems  Physical Exam Updated Vital Signs BP (!) 182/95   Pulse 71   Temp 98.1 F (36.7 C)   Resp 16   SpO2 98%  Physical Exam Vitals reviewed.  Constitutional:      Appearance: He is well-developed.  HENT:     Head: Normocephalic.     Mouth/Throat:     Mouth: Mucous membranes are moist.  Eyes:     General: No visual field deficit.    Extraocular Movements: Extraocular movements intact.   Cardiovascular:     Rate and Rhythm: Normal rate and regular rhythm.     Heart sounds: Normal heart sounds.  Pulmonary:     Effort: Pulmonary effort is normal.  Abdominal:     Palpations: Abdomen is soft.  Musculoskeletal:        General: Normal range of motion.     Cervical back: Normal range of motion and neck supple.  Skin:    General: Skin is warm and dry.     Capillary Refill: Capillary refill takes less than 2 seconds.  Neurological:     Mental Status: He is alert.     Cranial Nerves: No cranial nerve deficit, dysarthria or facial asymmetry.     Motor: No weakness.     Coordination: Coordination normal.  Psychiatric:        Mood and Affect: Mood normal.        Behavior: Behavior normal.     ED Results / Procedures / Treatments   Labs (all labs ordered are listed, but only abnormal results are displayed) Labs Reviewed  BASIC METABOLIC PANEL - Abnormal; Notable for the following components:      Result Value   Sodium 134 (*)    Glucose, Bld 118 (*)    All other components within normal limits  CBC - Abnormal; Notable for the following components:   WBC 3.6 (*)    All other components within normal limits  TROPONIN I (HIGH SENSITIVITY)  TROPONIN I (HIGH SENSITIVITY)    EKG EKG Interpretation  Date/Time:  Friday June 11 2022 13:27:52 EST Ventricular Rate:  75 PR Interval:  154 QRS Duration: 90 QT Interval:  410 QTC Calculation: 457 R Axis:   64 Text Interpretation: Normal sinus rhythm with sinus arrhythmia Normal ECG When compared with ECG of 10-Jun-2022 22:27, PREVIOUS ECG IS PRESENT Confirmed by Vivi Barrack [phone]) on 06/11/2022 1:34:07 PM  Radiology CT Head Wo Contrast  Result Date: 06/11/2022 CLINICAL DATA:  New onset headache EXAM: CT HEAD WITHOUT CONTRAST TECHNIQUE: Contiguous axial images were obtained from the base of the skull through the vertex without intravenous contrast. RADIATION DOSE REDUCTION: This exam was performed according to the  departmental dose-optimization program which includes automated exposure control, adjustment of the mA and/or kV according to patient size and/or use of iterative reconstruction technique. COMPARISON:  None Available. FINDINGS: Brain: No acute territorial infarction, hemorrhage, or intracranial mass. The ventricles are nonenlarged. Vascular: No hyperdense vessels.  Carotid vascular calcification Skull: Normal. Negative for fracture or focal lesion. Sinuses/Orbits: Mucosal thickening in the ethmoid and maxillary sinuses. Other: None IMPRESSION: Negative non contrasted CT appearance of the brain. Electronically Signed   By: Jasmine Pang M.D.   On: 06/11/2022 15:43   DG Chest 2 View  Result Date: 06/11/2022 CLINICAL DATA:  Uncontrolled hypertension, chest tightness. EXAM: CHEST - 2 VIEW COMPARISON:  None Available. FINDINGS: The heart size and mediastinal contours are within normal limits. Aortic arch atherosclerotic calcifications. Left basilar linear atelectasis. No appreciable pleural effusion. The visualized skeletal structures are unremarkable. IMPRESSION: Left basilar linear atelectasis.  No acute cardiopulmonary. Electronically Signed   By: Larose Hires D.O.   On: 06/11/2022 13:32   DG Chest 2 View  Result Date: 06/10/2022 CLINICAL DATA:  Cough EXAM: CHEST - 2 VIEW COMPARISON:  02/21/2018 FINDINGS: The heart size and mediastinal contours are within normal limits. Both lungs are clear. The visualized skeletal structures are unremarkable. IMPRESSION: No active cardiopulmonary disease. Electronically Signed   By: Deatra Robinson M.D.   On: 06/10/2022 22:27    Procedures Procedures    Medications Ordered in ED Medications - No data to display  ED Course/ Medical Decision Making/ A&P Clinical Course as of 06/11/22 1908  Fri Jun 11, 2022  5573 Basic metabolic panel reviewed and interpreted significant for mild hyponatremia with sodium 134 [DR]  1905 CBC(!) CBC reviewed and interpreted and  significant for mild leukopenia with a white count of 3600 [DR]    Clinical Course User Index [DR] Margarita Grizzle, MD                           Medical Decision Making 71 year old male who presents today with cough congestion, bandlike headache, and elevated blood pressure. He previously was evaluated and had [place] panel that was negative for COVID, flu, and RSV, chest x-Loida Calamia was obtained and showed no evidence of acute infiltrate Patient was reevaluated with labs and head CT due to some headache today.  These remain essentially within normal limits Patient has had blood pressures ranging from 1 40-1 80.  I suspect that this is due to situation, possibly some underlying hypertension, and also could be due to over-the-counter cough and cold medications which she has taken. I have discussed that he should not  take any over-the-counter cough and cold medications.  We have discussed acetaminophen for headache.  His headache has improved here. Patient is advised regarding need for outpatient follow-up and appears stable for discharge  Amount and/or Complexity of Data Reviewed Labs: ordered. Decision-making details documented in ED Course. Radiology: ordered and independent interpretation performed. Decision-making details documented in ED Course.           Final Clinical Impression(s) / ED Diagnoses Final diagnoses:  Upper respiratory tract infection, unspecified type  Nonintractable headache, unspecified chronicity pattern, unspecified headache type  Hypertension, unspecified type    Rx / DC Orders ED Discharge Orders     None         Pattricia Boss, MD 06/11/22 Einar Crow

## 2022-06-11 NOTE — Discharge Instructions (Addendum)
Your labs and chest x-ray look good.  I think you likely have a viral respiratory infection.  Your test for COVID, flu, and RSV was negative for those viruses, however there are lots of other viruses that we do not test for.  The treatment is the same: Drink plenty of fluids to stay hydrated, you can take over-the-counter medications such as Tylenol and ibuprofen for fever and discomfort.  STOP using the cough syrup that you had been taking (dextromethorphan).  If you feel the need to take medication for your cough, you can try a medicine like Coricidin (you can use generic brands of this, of course).  Look for medications that notes that they are for people with high blood pressure.  These are safer for you to use.  Please follow-up with your primary care provider.  If you have severe worsening your symptoms, weakness on one side your body, chest pain, confusion, shortness of breath, return to the emergency department.

## 2022-06-11 NOTE — ED Provider Triage Note (Signed)
Emergency Medicine Provider Triage Evaluation Note  Linus Orn , a 71 y.o. male  was evaluated in triage.  Pt complains of headache, uncontrolled blood pressure, general malaise.  He was recently seen and evaluated this morning, discharged with likely viral respiratory infection, there is some suspicion that cough medicine with dextromethorphan may have been causing some of his symptoms.  He reports that he has not taken any more DM cough medicine but continues to have severe headache, poorly controlled blood pressure.  He endorses some chest tightness but denies chest pain, shortness of breath.  Review of Systems  Positive: Chest tightness, headache, hypertension Negative: Chest pain, shob  Physical Exam  BP (!) 166/102 (BP Location: Right Arm)   Pulse 83   Temp 98.1 F (36.7 C)   Resp (!) 22   SpO2 96%  Gen:   Awake, no distress   Resp:  Normal effort  MSK:   Moves extremities without difficulty  Other:  CN2-12 grossly intact, moves all limbs spontaneously  Medical Decision Making  Medically screening exam initiated at 1:04 PM.  Appropriate orders placed.  Linus Orn. was informed that the remainder of the evaluation will be completed by another provider, this initial triage assessment does not replace that evaluation, and the importance of remaining in the ED until their evaluation is complete.  Workup initiated   Olene Floss, [place] 06/11/22 1307

## 2022-06-11 NOTE — ED Notes (Signed)
Interpreter used to discuss discharge care instructions with patient. Pt communicated understanding. Provided food and drink. Ambulated from ED without assistance.

## 2022-06-14 ENCOUNTER — Emergency Department (HOSPITAL_COMMUNITY)
Admission: EM | Admit: 2022-06-14 | Discharge: 2022-06-14 | Disposition: A | Discharge status: Home / Self Care | Payer: Medicare Other | Attending: Emergency Medicine | Admitting: Emergency Medicine

## 2022-06-14 ENCOUNTER — Other Ambulatory Visit: Payer: Self-pay

## 2022-06-14 DIAGNOSIS — Z79899 Other long term (current) drug therapy: Secondary | ICD-10-CM | POA: Insufficient documentation

## 2022-06-14 DIAGNOSIS — R519 Headache, unspecified: Secondary | ICD-10-CM

## 2022-06-14 DIAGNOSIS — R509 Fever, unspecified: Secondary | ICD-10-CM | POA: Diagnosis not present

## 2022-06-14 DIAGNOSIS — Z1152 Encounter for screening for COVID-19: Secondary | ICD-10-CM | POA: Insufficient documentation

## 2022-06-14 DIAGNOSIS — I10 Essential (primary) hypertension: Secondary | ICD-10-CM | POA: Insufficient documentation

## 2022-06-14 LAB — CBC WITH DIFFERENTIAL/PLATELET
Abs Immature Granulocytes: 0.01 10*3/uL (ref 0.00–0.07)
Basophils Absolute: 0 10*3/uL (ref 0.0–0.1)
Basophils Relative: 0 %
Eosinophils Absolute: 0 10*3/uL (ref 0.0–0.5)
Eosinophils Relative: 0 %
HCT: 43.2 % (ref 39.0–52.0)
Hemoglobin: 15.2 g/dL (ref 13.0–17.0)
Immature Granulocytes: 0 %
Lymphocytes Relative: 29 %
Lymphs Abs: 1 10*3/uL (ref 0.7–4.0)
MCH: 32.1 pg (ref 26.0–34.0)
MCHC: 35.2 g/dL (ref 30.0–36.0)
MCV: 91.3 fL (ref 80.0–100.0)
Monocytes Absolute: 0.3 10*3/uL (ref 0.1–1.0)
Monocytes Relative: 9 %
Neutro Abs: 2.2 10*3/uL (ref 1.7–7.7)
Neutrophils Relative %: 62 %
Platelets: 230 10*3/uL (ref 150–400)
RBC: 4.73 MIL/uL (ref 4.22–5.81)
RDW: 12.8 % (ref 11.5–15.5)
WBC: 3.5 10*3/uL — ABNORMAL LOW (ref 4.0–10.5)
nRBC: 0 % (ref 0.0–0.2)

## 2022-06-14 LAB — BASIC METABOLIC PANEL
Anion gap: 11 (ref 5–15)
BUN: 10 mg/dL (ref 8–23)
CO2: 22 mmol/L (ref 22–32)
Calcium: 9.1 mg/dL (ref 8.9–10.3)
Chloride: 101 mmol/L (ref 98–111)
Creatinine, Ser: 1.06 mg/dL (ref 0.61–1.24)
GFR, Estimated: >60 mL/min (ref >60)
Glucose, Bld: 112 mg/dL — ABNORMAL HIGH (ref 70–99)
Potassium: 3.5 mmol/L (ref 3.5–5.1)
Sodium: 134 mmol/L — ABNORMAL LOW (ref 135–145)

## 2022-06-14 LAB — RESP PANEL BY RT-PCR (RSV, FLU A&B, COVID)  RVPGX2
Influenza A by PCR: NEGATIVE
Influenza B by PCR: NEGATIVE
Resp Syncytial Virus by PCR: NEGATIVE
SARS Coronavirus 2 by RT PCR: NEGATIVE

## 2022-06-14 MED ORDER — ACETAMINOPHEN 500 MG PO TABS
1000.0000 mg | ORAL_TABLET | Freq: Once | ORAL | Status: AC
  Administered 2022-06-14: 1000 mg via ORAL
  Filled 2022-06-14: qty 2

## 2022-06-14 MED ORDER — AMLODIPINE BESYLATE 5 MG PO TABS: 5.0000 mg | ORAL_TABLET | Freq: Every day | ORAL | 2 refills | Status: DC

## 2022-06-14 MED ORDER — AMLODIPINE BESYLATE 5 MG PO TABS
5.0000 mg | ORAL_TABLET | Freq: Once | ORAL | Status: AC
  Administered 2022-06-14: 5 mg via ORAL
  Filled 2022-06-14: qty 1

## 2022-06-14 NOTE — Discharge Instructions (Addendum)
You were evaluated today for headache and high blood pressure.  I have prescribed a new medication to help with your blood pressure.  Please take this daily as prescribed.  I have prescribed enough medication to get you through the next 3 months.  Please schedule a follow-up appointment with your primary care provider for further evaluation and management as they will need to be the ones to manage your hypertensive medications.  If your headache suddenly becomes much worse or becomes the worst of your life, please return to the emergency department for reevaluation.

## 2022-06-14 NOTE — ED Triage Notes (Signed)
Patient reports feeling hot this morning , hypertensive BP= 170/98 by EMS , afebrile at triage .

## 2022-06-14 NOTE — ED Provider Notes (Signed)
[facility] EMERGENCY DEPARTMENT Provider Note   CSN: [id number] Arrival date & time: 06/14/22  0249     History  Chief Complaint  Patient presents with   [name] / Hypertensive    Bobby Griffith. is a 71 y.o. male.  Patient presents to the emergency department via EMS complaining of feeling hot with high blood pressure.  Patient states that he feels that his blood pressure has been high.  EMS noted his blood pressure was 170/98.  He states that he feels flushed and hot all over.  He denies shortness of breath, chest pain, abdominal pain, nausea, vomiting.  He does endorse a mild to moderate generalized headache.  The patient does not take blood pressure medication at this time.  Past medical history significant for bilateral deafness, previous left heart cath  HPI     Home Medications Prior to Admission medications   Medication Sig Start Date End Date Taking? Authorizing Provider  amLODipine (NORVASC) 5 MG tablet Take 1 tablet (5 mg total) by mouth daily. 06/14/22 09/12/22 Yes Darrick Grinder, PA-C  acetaminophen (TYLENOL) 325 MG tablet Take 325 mg by mouth 2 (two) times daily as needed for mild pain. Patient not taking: Reported on 04/20/2022    [provider]  pantoprazole (PROTONIX) 40 MG tablet Take 1 tablet (40 mg total) by mouth daily. 04/20/22   Garnette Gunner, MD  sildenafil (VIAGRA) 25 MG tablet Take 1 tablet (25 mg total) by mouth daily as needed for erectile dysfunction. Patient not taking: Reported on 04/20/2022 02/03/22   Gerre Scull, NP      Allergies    Patient has no known allergies.    Review of Systems   Review of Systems  Constitutional:  Negative for fever.  Respiratory:  Negative for shortness of breath.   Cardiovascular:  Negative for chest pain and palpitations.  Gastrointestinal:  Negative for abdominal pain, constipation, diarrhea, nausea and vomiting.  Neurological:  Positive for headaches. Negative for  dizziness, syncope and weakness.    Physical Exam Updated Vital Signs BP 110/87 (BP Location: Right Arm)   Pulse 65   Temp 98.5 F (36.9 C) (Oral)   Resp 16   SpO2 100%  Physical Exam Vitals and nursing note reviewed.  Constitutional:      General: He is not in acute distress.    Appearance: He is well-developed.  HENT:     Head: Normocephalic and atraumatic.  Eyes:     Extraocular Movements: Extraocular movements intact.     Conjunctiva/sclera: Conjunctivae normal.     Pupils: Pupils are equal, round, and reactive to light.  Cardiovascular:     Rate and Rhythm: Normal rate and regular rhythm.     Heart sounds: No murmur heard. Pulmonary:     Effort: Pulmonary effort is normal. No respiratory distress.     Breath sounds: Normal breath sounds.  Abdominal:     Palpations: Abdomen is soft.     Tenderness: There is no abdominal tenderness.  Musculoskeletal:        General: No swelling.     Cervical back: Neck supple.  Skin:    General: Skin is warm and dry.     Capillary Refill: Capillary refill takes less than 2 seconds.  Neurological:     Mental Status: He is alert.     Comments: CNII-VII, XI, XII intact  Psychiatric:        Mood and Affect: Mood normal.  ED Results / Procedures / Treatments   Labs (all labs ordered are listed, but only abnormal results are displayed) Labs Reviewed  CBC WITH DIFFERENTIAL/PLATELET - Abnormal; Notable for the following components:      Result Value   WBC 3.5 (*)    All other components within normal limits  BASIC METABOLIC PANEL - Abnormal; Notable for the following components:   Sodium 134 (*)    Glucose, Bld 112 (*)    All other components within normal limits  RESP PANEL BY RT-PCR (RSV, FLU A&B, COVID)  [id number]    EKG None  Radiology No results found.  Procedures Procedures    Medications Ordered in ED Medications  amLODipine (NORVASC) tablet 5 mg (5 mg Oral Given 06/14/22 1155)  acetaminophen (TYLENOL) tablet  1,000 mg (1,000 mg Oral Given 06/14/22 1155)    ED Course/ Medical Decision Making/ A&P                           Medical Decision Making  Patient presents with a chief complaint of high blood pressure and headache.  Differential diagnosis includes but is not limited to uncontrolled hypertension, hypertensive urgency, intracranial abnormalities, tension headache, migraine, others  I viewed the patient's past medical history.  The patient was evaluated in the emergency department on December 14 and diagnosed with a cough and again on December 15 due to an upper respiratory tract infection.  The patient was hypertensive at both of those visits with a recorded blood pressure of 182/95 on December 15.  I ordered and reviewed lab results.  Pertinent results include unremarkable CBC, BMP, negative respiratory panel  I considered a head CT for the patient's headache but the patient rates his headache is mild to moderate in severity.  There are no red flags such as persistent vomiting or injury that would necessitate head CT at this time  I ordered the patient amlodipine for hypertension and Tylenol for his headache.  Upon reassessment patient was feeling somewhat better.  I see medication at this time for admission.  Patient has a new diagnosis of uncontrolled hypertension.  Plan to discharge patient home with prescription for amlodipine with 2 refills with recommendation for follow-up with primary care.  Patient's headache is likely tension in nature based on description.  No indication at this time for imaging.  No nausea, vomiting, dizziness, loss of consciousness or other concerning symptoms to coincide with the headache.  The patient is afebrile and his respiratory panel was negative.  His white count is not elevated.  Unclear etiology of the patient feeling "hot".  I wonder if the patient is feeling flushed due to his blood pressure being elevated.  I see no need at this time for further emergent  evaluation of the patient's "hot" feeling.  Patient was given return precautions including if his headache worsens or becomes the worst of his life.        Final Clinical Impression(s) / ED Diagnoses Final diagnoses:  Uncontrolled hypertension  Acute nonintractable headache, unspecified headache type    Rx / DC Orders ED Discharge Orders          Ordered    amLODipine (NORVASC) 5 MG tablet  Daily        06/14/22 1059              Pamala Duffel 06/14/22 1212    Tegeler, Canary Brim, MD 06/14/22 1402

## 2022-06-14 NOTE — ED Provider Triage Note (Signed)
  Emergency Medicine Provider Triage Evaluation Note  MRN:  [id number]  Arrival date & time: 06/14/22    Medically screening exam initiated at 2:58 AM.   CC:   Feels Hot / Hypertensive   HPI:  Bobby Griffith. is a 70 y.o. year-old male presents to the ED with chief complaint of subjective fever and elevated blood pressure. States that he woke up sweating tonight.  He denies any other problems other than being concerned that his BP is high.  Denies CP, SOB, or any other symptoms.  History provided by patient VIA ASL interpreter. ROS:  -As included in HPI PE:   Vitals:   06/14/22 0254  BP: (!) 187/106  Pulse: 68  Resp: 17  Temp: 97.9 F (36.6 C)  SpO2: 96%    Non-toxic appearing No respiratory distress  MDM:  Based on signs and symptoms, flu is highest on my differential. I've ordered labs in triage to expedite lab/diagnostic workup.  Patient was informed that the remainder of the evaluation will be completed by another provider, this initial triage assessment does not replace that evaluation, and the importance of remaining in the ED until their evaluation is complete.    Roxy Horseman, PA-C 06/14/22 0301

## 2022-06-22 ENCOUNTER — Emergency Department (HOSPITAL_COMMUNITY): Admission: EM | Admit: 2022-06-22 | Payer: Medicare Other | Source: Home / Self Care

## 2022-06-23 ENCOUNTER — Other Ambulatory Visit: Payer: Self-pay

## 2022-06-23 ENCOUNTER — Emergency Department (HOSPITAL_COMMUNITY): Payer: Medicare Other

## 2022-06-23 ENCOUNTER — Emergency Department (HOSPITAL_COMMUNITY)
Admission: EM | Admit: 2022-06-23 | Discharge: 2022-06-23 | Disposition: A | Discharge status: Home / Self Care | Payer: Medicare Other | Attending: Emergency Medicine | Admitting: Emergency Medicine

## 2022-06-23 ENCOUNTER — Encounter (HOSPITAL_COMMUNITY): Payer: Self-pay | Admitting: Emergency Medicine

## 2022-06-23 DIAGNOSIS — I10 Essential (primary) hypertension: Secondary | ICD-10-CM | POA: Diagnosis not present

## 2022-06-23 DIAGNOSIS — R457 State of emotional shock and stress, unspecified: Secondary | ICD-10-CM | POA: Diagnosis not present

## 2022-06-23 DIAGNOSIS — R519 Headache, unspecified: Secondary | ICD-10-CM

## 2022-06-23 DIAGNOSIS — Z20822 Contact with and (suspected) exposure to covid-19: Secondary | ICD-10-CM | POA: Insufficient documentation

## 2022-06-23 DIAGNOSIS — J069 Acute upper respiratory infection, unspecified: Secondary | ICD-10-CM | POA: Diagnosis not present

## 2022-06-23 DIAGNOSIS — G4489 Other headache syndrome: Secondary | ICD-10-CM | POA: Diagnosis not present

## 2022-06-23 DIAGNOSIS — Z79899 Other long term (current) drug therapy: Secondary | ICD-10-CM | POA: Insufficient documentation

## 2022-06-23 DIAGNOSIS — R58 Hemorrhage, not elsewhere classified: Secondary | ICD-10-CM | POA: Diagnosis not present

## 2022-06-23 DIAGNOSIS — R42 Dizziness and giddiness: Secondary | ICD-10-CM | POA: Diagnosis not present

## 2022-06-23 HISTORY — DX: Essential (primary) hypertension: I10

## 2022-06-23 LAB — CBC WITH DIFFERENTIAL/PLATELET
Abs Immature Granulocytes: 0.01 10*3/uL (ref 0.00–0.07)
Basophils Absolute: 0 10*3/uL (ref 0.0–0.1)
Basophils Relative: 0 %
Eosinophils Absolute: 0 10*3/uL (ref 0.0–0.5)
Eosinophils Relative: 1 %
HCT: 43.4 % (ref 39.0–52.0)
Hemoglobin: 15 g/dL (ref 13.0–17.0)
Immature Granulocytes: 0 %
Lymphocytes Relative: 40 %
Lymphs Abs: 1.8 10*3/uL (ref 0.7–4.0)
MCH: 32 pg (ref 26.0–34.0)
MCHC: 34.6 g/dL (ref 30.0–36.0)
MCV: 92.5 fL (ref 80.0–100.0)
Monocytes Absolute: 0.4 10*3/uL (ref 0.1–1.0)
Monocytes Relative: 10 %
Neutro Abs: 2.2 10*3/uL (ref 1.7–7.7)
Neutrophils Relative %: 49 %
Platelets: 256 10*3/uL (ref 150–400)
RBC: 4.69 MIL/uL (ref 4.22–5.81)
RDW: 13.6 % (ref 11.5–15.5)
WBC: 4.5 10*3/uL (ref 4.0–10.5)
nRBC: 0 % (ref 0.0–0.2)

## 2022-06-23 LAB — COMPREHENSIVE METABOLIC PANEL
ALT: 18 U/L (ref 0–44)
AST: 16 U/L (ref 15–41)
Albumin: 3.8 g/dL (ref 3.5–5.0)
Alkaline Phosphatase: 53 U/L (ref 38–126)
Anion gap: 7 (ref 5–15)
BUN: 13 mg/dL (ref 8–23)
CO2: 22 mmol/L (ref 22–32)
Calcium: 9.3 mg/dL (ref 8.9–10.3)
Chloride: 106 mmol/L (ref 98–111)
Creatinine, Ser: 1.04 mg/dL (ref 0.61–1.24)
GFR, Estimated: >60 mL/min (ref >60)
Glucose, Bld: 105 mg/dL — ABNORMAL HIGH (ref 70–99)
Potassium: 3.6 mmol/L (ref 3.5–5.1)
Sodium: 135 mmol/L (ref 135–145)
Total Bilirubin: 1.3 mg/dL — ABNORMAL HIGH (ref 0.3–1.2)
Total Protein: 7.3 g/dL (ref 6.5–8.1)

## 2022-06-23 LAB — RESP PANEL BY RT-PCR (RSV, FLU A&B, COVID)  RVPGX2
Influenza A by PCR: NEGATIVE
Influenza B by PCR: NEGATIVE
Resp Syncytial Virus by PCR: NEGATIVE
SARS Coronavirus 2 by RT PCR: NEGATIVE

## 2022-06-23 LAB — URINALYSIS, ROUTINE W REFLEX MICROSCOPIC
Bilirubin Urine: NEGATIVE
Glucose, UA: NEGATIVE mg/dL
Hgb urine dipstick: NEGATIVE
Ketones, ur: NEGATIVE mg/dL
Leukocytes,Ua: NEGATIVE
Nitrite: NEGATIVE
Protein, ur: NEGATIVE mg/dL
Specific Gravity, Urine: 1.009 (ref 1.005–1.030)
pH: 6 (ref 5.0–8.0)

## 2022-06-23 LAB — LIPASE, BLOOD: Lipase: 34 U/L (ref 11–51)

## 2022-06-23 MED ORDER — ONDANSETRON 4 MG PO TBDP
4.0000 mg | ORAL_TABLET | Freq: Once | ORAL | Status: AC
  Administered 2022-06-23: 4 mg via ORAL
  Filled 2022-06-23: qty 1

## 2022-06-23 NOTE — ED Triage Notes (Addendum)
Arrives via EMS for HA, dizziness, and nosebleed (not currently bleeding with EMS). Endorses recent N/V x 4 around 11pm, fever.   Episode resolved at the time pt presents to ER in triage room.   H/o HTN, does not take daily meds  EMS VS: 166/106. 86bpm, 99%, CBG 110

## 2022-06-23 NOTE — ED Provider Notes (Signed)
[facility]  CSN: [id number] Arrival date & time: 06/23/22 [id number]  Chief Complaint(s) Headache  HPI Bobby OrnWilliam G Elster Jr. is a 71 y.o. male with history of hypertension presenting to the emergency department with headache.  Patient reports headache yesterday.  He reports it was gradual onset.  He reports associated cough, runny nose, body aches.  No sore throat.  He reports nausea, vomiting and diarrhea.  Symptoms are mild.  He ate some food and drink Gatorade earlier which helped with his symptoms.  He reports a nosebleed earlier as well subjective fevers and chills.  Currently feels much better.  Certified ASL interpreter used  Past Medical History Past Medical History:  Diagnosis Date   Deaf, bilateral    HTN (hypertension)    Patient Active Problem List   Diagnosis Date Noted   Primary hypertension 01/27/2022   Erectile dysfunction 10/21/2021   Home Medication(s) Prior to Admission medications   Medication Sig Start Date End Date Taking? Authorizing Provider  acetaminophen (TYLENOL) 325 MG tablet Take 325 mg by mouth 2 (two) times daily as needed for mild pain. Patient not taking: Reported on 04/20/2022    [provider]  amLODipine (NORVASC) 5 MG tablet Take 1 tablet (5 mg total) by mouth daily. 06/14/22 09/12/22  Darrick GrinderMcCauley, Larry B, PA-C  pantoprazole (PROTONIX) 40 MG tablet Take 1 tablet (40 mg total) by mouth daily. 04/20/22   Garnette Gunnerhompson, Aaron B, MD  sildenafil (VIAGRA) 25 MG tablet Take 1 tablet (25 mg total) by mouth daily as needed for erectile dysfunction. Patient not taking: Reported on 04/20/2022 02/03/22   Gerre ScullMcElwee, Lauren A, NP                                                                                                                                    Past Surgical History Past Surgical History:  Procedure Laterality Date   ABDOMINAL SURGERY     LEFT HEART CATH AND CORONARY ANGIOGRAPHY N/A 02/23/2018    Procedure: LEFT HEART CATH AND CORONARY ANGIOGRAPHY;  Surgeon: Kathleene HazelMcAlhany, Christopher D, MD;  Location: [facility] INVASIVE CV LAB;  Service: Cardiovascular;  Laterality: N/A;   Family History No family history on file.  Social History Social History   Tobacco Use   Smoking status: Never   Smokeless tobacco: Never  Vaping Use   Vaping Use: Never used  Substance Use Topics   Alcohol use: Yes    Comment: socially   Drug use: Never   Allergies Patient has no known allergies.  Review of Systems Review of Systems  All other systems reviewed and are negative.   Physical Exam Vital Signs  I have reviewed the triage vital signs BP 137/82 (BP Location: Left Arm)   Pulse 80   Temp 98.9 F (37.2 C) (Oral)   Resp 14   Wt 71 kg   SpO2 98%   BMI 23.11 kg/m  Physical  Exam Vitals and nursing note reviewed.  Constitutional:      General: He is not in acute distress.    Appearance: Normal appearance.  HENT:     Mouth/Throat:     Mouth: Mucous membranes are moist.  Eyes:     Conjunctiva/sclera: Conjunctivae normal.  Cardiovascular:     Rate and Rhythm: Normal rate and regular rhythm.  Pulmonary:     Effort: Pulmonary effort is normal. No respiratory distress.     Breath sounds: Normal breath sounds.  Abdominal:     General: Abdomen is flat.     Palpations: Abdomen is soft.     Tenderness: There is no abdominal tenderness.  Musculoskeletal:     Right lower leg: No edema.     Left lower leg: No edema.  Skin:    General: Skin is warm and dry.     Capillary Refill: Capillary refill takes less than 2 seconds.  Neurological:     Mental Status: He is alert and oriented to person, place, and time. Mental status is at baseline.     Comments: Cranial nerves II through XII intact, strength 5 out of 5 in the bilateral upper and lower extremities, no sensory deficit to light touch, no dysmetria on finger-nose-finger testing, ambulatory with steady gait.  Psychiatric:        Mood and Affect:  Mood normal.        Behavior: Behavior normal.     ED Results and Treatments Labs (all labs ordered are listed, but only abnormal results are displayed) Labs Reviewed  COMPREHENSIVE METABOLIC PANEL - Abnormal; Notable for the following components:      Result Value   Glucose, Bld 105 (*)    Total Bilirubin 1.3 (*)    All other components within normal limits  RESP PANEL BY RT-PCR (RSV, FLU A&B, COVID)  RVPGX2  LIPASE, BLOOD  CBC WITH DIFFERENTIAL/PLATELET  URINALYSIS, ROUTINE W REFLEX MICROSCOPIC                                                                                                                          Radiology CT HEAD WO CONTRAST ( )  Result Date: 06/23/2022 CLINICAL DATA:  71 year old male with dizziness, epistaxis. Fever. TIA. EXAM: CT HEAD WITHOUT CONTRAST TECHNIQUE: Contiguous axial images were obtained from the base of the skull through the vertex without intravenous contrast. RADIATION DOSE REDUCTION: This exam was performed according to the departmental dose-optimization program which includes automated exposure control, adjustment of the mA and/or kV according to patient size and/or use of iterative reconstruction technique. COMPARISON:  Head CT 06/11/2022. FINDINGS: Brain: Cerebral volume is within normal limits for age. No midline shift, ventriculomegaly, mass effect, evidence of mass lesion, intracranial hemorrhage or evidence of cortically based acute infarction. Gray-white differentiation appears stable and within normal limits for age. No cortical encephalomalacia identified. Vascular: Calcified atherosclerosis at the skull base. No suspicious intracranial vascular hyperdensity. Skull: No acute osseous abnormality identified. Sinuses/Orbits: Persistent 2 cm  polypoid opacity in the right nasal cavity on series 3, image 6. Partially visible maxillary sinus polypoid opacity, probably retention cysts. Other paranasal sinuses are well aerated. Tympanic cavities and  mastoids are clear. Other: Visualized orbits and scalp soft tissues are within normal limits. IMPRESSION: 1. Stable and normal for age non contrast CT appearance of the brain. 2. Maxillary sinus probable mucous retention cysts, and 2 cm polypoid opacity also in the right nasal cavity. Recommend follow-up with ENT given history of epistaxis. Electronically Signed   By: Odessa Fleming M.D.   On: 06/23/2022 04:01    Pertinent labs & imaging results that were available during my care of the patient were reviewed by me and considered in my medical decision making (see MDM for details).  Medications Ordered in ED Medications  ondansetron (ZOFRAN-ODT) disintegrating tablet 4 mg (4 mg Oral Given 06/23/22 0318)                                                                                                                                     Procedures Procedures  (including critical care time)  Medical Decision Making / ED Course   MDM:  71 year old male presenting to the emergency department with headache.  Patient overall well-appearing, physical exam reassuring including normal neurologic exam.  Vital signs reassuring with no fever, tachycardia.  Suspect headache likely due to viral syndrome.  Patient also has constellation of symptoms suggestive of viral syndrome such as runny nose and diarrhea.  His COVID/flu swab is negative.  Suspect alternative viral URI.  CT head obtained and is negative for acute intracranial process,, such as bleeding or mass effect, intracranial tumor.  Very low concern for alternative cause of headache such as venous sinus thrombosis, carbon monoxide poisoning given that the symptoms have resolved and are mild to begin with.  Laboratory testing otherwise reassuring.  The patient reports nausea, vomiting, diarrhea, doubt acute intra-abdominal process such as obstruction, colitis, appendicitis with no abdominal pain or tenderness at this time.  Advise close follow-up with primary  physician and continued self-care for symptomatic relief of symptoms.       Lab Tests: -I ordered, reviewed, and interpreted labs.   The pertinent results include:   Labs Reviewed  COMPREHENSIVE METABOLIC PANEL - Abnormal; Notable for the following components:      Result Value   Glucose, Bld 105 (*)    Total Bilirubin 1.3 (*)    All other components within normal limits  RESP PANEL BY RT-PCR (RSV, FLU A&B, COVID)  RVPGX2  LIPASE, BLOOD  CBC WITH DIFFERENTIAL/PLATELET  URINALYSIS, ROUTINE W REFLEX MICROSCOPIC       Imaging Studies ordered: I ordered imaging studies including CT head On my interpretation imaging demonstrates no acute process I independently visualized and interpreted imaging. I agree with the radiologist interpretation   Medicines ordered and prescription drug management: Meds ordered this encounter  Medications   ondansetron (ZOFRAN-ODT)  disintegrating tablet 4 mg    -I have reviewed the patients home medicines and have made adjustments as needed    Social Determinants of Health:  Diagnosis or treatment significantly limited by social determinants of health: deaf   Reevaluation: After the interventions noted above, I reevaluated the patient and found that they have improved  Co morbidities that complicate the patient evaluation  Past Medical History:  Diagnosis Date   Deaf, bilateral    HTN (hypertension)       Dispostion: Disposition decision including need for hospitalization was considered, and patient discharged from emergency department.    Final Clinical Impression(s) / ED Diagnoses Final diagnoses:  Acute nonintractable headache, unspecified headache type  Upper respiratory tract infection, unspecified type     This chart was dictated using voice recognition software.  Despite best efforts to proofread,  errors can occur which can change the documentation meaning.    Lonell Grandchild, MD 06/23/22 1626

## 2022-06-23 NOTE — ED Provider Triage Note (Signed)
  Emergency Medicine Provider Triage Evaluation Note  MRN:  [id number]  Arrival date & time: 06/23/22    Medically screening exam initiated at 3:17 AM.   CC:   Headache   HPI:  Bobby Griffith. is a 71 y.o. year-old male presents to the ED with chief complaint of headache, elevated blood pressure, nosebleed, fever, and vomiting.  Onset tonight PTA.  He also had an episode of blurry vision in his right eye in triage, which resolved after a few minutes.  He says that happens occasionally and is not the reason he is here.  History provided by patient. ROS:  -As included in HPI PE:   Vitals:   06/23/22 0308 06/23/22 0313  BP:  (!) 143/81  Pulse: 79   Resp: 16   Temp:  97.9 F (36.6 C)  SpO2: 95%     Non-toxic appearing No respiratory distress  MDM:   I've ordered labs and imaging in triage to expedite lab/diagnostic workup.  Patient was informed that the remainder of the evaluation will be completed by another provider, this initial triage assessment does not replace that evaluation, and the importance of remaining in the ED until their evaluation is complete.    Roxy Horseman, PA-C 06/23/22 [phone]

## 2022-06-23 NOTE — ED Notes (Signed)
Pt is deaf and sitting by vending machines.

## 2022-06-23 NOTE — ED Notes (Signed)
RN using interpretor connected with  EZRA# [id number]  Pt states last night around 2030 he had felt sick to stomach. Pt felt nauseous then vomited 4x. Pt tried to sleep around 2100-0000. Pt was woken up by feverish symptoms and nose felt funny. Nose started to feel like it was running. Pt took Cough DM [facility] brand medication that made him feel dizzy and weird. Pt states this is his first time taking medication. Pt states around 0200 he ate a snack and drank Gatorade that relieved his symptoms.  Pt currently states he is fine but he is scared once he gets home the symptoms will come back.

## 2022-06-23 NOTE — ED Notes (Signed)
Pt did not answer x3 for updated VS.

## 2022-06-23 NOTE — Discharge Instructions (Addendum)
We evaluated you for your headache.  Your head CT scan was normal and your neurologic exam was reassuring.  Your headache is probably due to an upper respiratory viral infection.  Your COVID and flu test were negative.  Please take Tylenol and Motrin for your symptoms at home.  You can take 650 mg of Tylenol every 6 hours and 600 mg of ibuprofen every 6 hours as needed for your symptoms.  You can take these medicines together as needed, either at the same time, or alternating every 3 hours.   Please return to the emergency department if you have any vision changes, weakness or tingling on one side of your body, facial droop, trouble speaking, worsening symptoms, persistent vomiting, or any other concerning symptoms.

## 2022-06-24 ENCOUNTER — Telehealth: Payer: Self-pay

## 2022-06-24 NOTE — Telephone Encounter (Signed)
Transition Care Management Follow-up Telephone Call Date of discharge and from where: 06/23/22 [facility] ED. Dx: Headache How have you been since you were released from the hospital? I'm doing better, I'm feeling better Any questions or concerns? No  Items Reviewed: Did the pt receive and understand the discharge instructions provided? Yes  Medications obtained and verified? Yes  Other? No  Any new allergies since your discharge? No  Dietary orders reviewed? No Do you have support at home? Yes   Home Care and Equipment/Supplies: Were home health services ordered? not applicable If so, what is the name of the agency? N/a  Has the agency set up a time to come to the patient's home? not applicable Were any new equipment or medical supplies ordered?  No What is the name of the medical supply agency? N/a Were you able to get the supplies/equipment? not applicable Do you have any questions related to the use of the equipment or supplies? No  Functional Questionnaire: (I = Independent and D = Dependent) ADLs: I  Bathing/Dressing- I  Meal Prep- I  Eating- I  Maintaining continence- I  Transferring/Ambulation- I  Managing Meds- I  Follow up appointments reviewed:  PCP Hospital f/u appt confirmed? No  Scheduled to see N/A on N/A @ N/A. Pt declines to schedule, does not feel the need to schedule an appt Specialist Hospital f/u appt confirmed? No  Scheduled to see N/A on N/A @ N/A. Are transportation arrangements needed? No  If their condition worsens, is the pt aware to call PCP or go to the Emergency Dept.? Yes Was the patient provided with contact information for the PCP's office or ED? Yes. Pt also informed to call the office if he needs anything before going to the ED and we will do everything we can to get him in to be seen. Was to pt encouraged to call back with questions or concerns? Yes  Arvil Persons, RN, BSN RN Clinical Supervisor [employer]

## 2022-07-30 ENCOUNTER — Ambulatory Visit: Payer: Medicare Other | Admitting: Nurse Practitioner

## 2022-08-03 NOTE — Progress Notes (Signed)
   Established Patient Office Visit  Subjective   Patient ID: Bobby Griffith., male    DOB: 06-Mar-1951  Age: 72 y.o. MRN: [id number]  No chief complaint on file.   HPI  Bobby Griffith. Is here to follow-up on hypertension.   {History (Optional):23778}  ROS    Objective:     There were no vitals taken for this visit. {Vitals History (Optional):23777}  Physical Exam   No results found for any visits on 08/04/22.  {Labs (Optional):23779}  The 10-year ASCVD risk score (Arnett DK, et al., 2019) is: 22.4%    Assessment & Plan:   Problem List Items Addressed This Visit   None   No follow-ups on file.    Charyl Dancer, NP

## 2022-08-04 ENCOUNTER — Encounter: Payer: Self-pay | Admitting: Nurse Practitioner

## 2022-08-04 ENCOUNTER — Ambulatory Visit (INDEPENDENT_AMBULATORY_CARE_PROVIDER_SITE_OTHER): Payer: 59 | Admitting: Nurse Practitioner

## 2022-08-04 VITALS — BP 150/82 | HR 78 | Temp 98.8°F | Ht 69.0 in | Wt 160.0 lb

## 2022-08-04 DIAGNOSIS — I10 Essential (primary) hypertension: Secondary | ICD-10-CM | POA: Diagnosis not present

## 2022-08-04 DIAGNOSIS — E7800 Pure hypercholesterolemia, unspecified: Secondary | ICD-10-CM

## 2022-08-04 DIAGNOSIS — K219 Gastro-esophageal reflux disease without esophagitis: Secondary | ICD-10-CM | POA: Diagnosis not present

## 2022-08-04 MED ORDER — LOSARTAN POTASSIUM-HCTZ 50-12.5 MG PO TABS: 1.0000 | ORAL_TABLET | Freq: Every day | ORAL | 1 refills | Status: DC

## 2022-08-04 NOTE — Patient Instructions (Addendum)
It was great to see you!  Start taking the amlodipine 1 tablet  once a day for your blood pressure. I recommend you get a blood pressure cuff at home to start checking your blood pressure. Goal is for your blood pressure to be less than 130/90.   Protonix you can take once a day as needed for heart burn.   Follow-up with your pharmacy to see if you got the Pneumonia vaccine or the second shingles vaccine.   Call to schedule a colonoscopy at [facility]: [phone]  Let's follow-up in 4 weeks, sooner if you have concerns.  If a referral was placed today, you will be contacted for an appointment. Please note that routine referrals can sometimes take up to 3-4 weeks to process. Please call our office if you haven't heard anything after this time frame.  Take care,  Vance Peper, NP

## 2022-08-04 NOTE — Assessment & Plan Note (Signed)
ASCVD score 26%, however LDL recently 113. He declines statin at this time.

## 2022-08-04 NOTE — Assessment & Plan Note (Signed)
Chronic, stable. Continue protonix 40mg  daily as needed for symptoms.

## 2022-08-04 NOTE — Assessment & Plan Note (Signed)
Chronic, not controlled. BP is 150/82 today. He states he took amlodipine once and it made him dizzy. Will have him start losartan-hctz 50-12.5mg  daily. Recent BMP and CBC reviewed from ER. Discussed nutrition and limiting salt at length. Follow-up in 4 weeks.

## 2022-08-06 ENCOUNTER — Telehealth: Payer: Self-pay | Admitting: Nurse Practitioner

## 2022-08-06 NOTE — Telephone Encounter (Signed)
Patients daughter called and asked if the medication losartan-hydrochlorothiazide (HYZAAR) 50-12.5 MG tablet can be changed because its causing dizziness. Please let know at [phone]

## 2022-08-09 ENCOUNTER — Telehealth: Payer: Self-pay | Admitting: Nurse Practitioner

## 2022-08-09 MED ORDER — LOSARTAN POTASSIUM 25 MG PO TABS: 25.0000 mg | ORAL_TABLET | Freq: Every day | ORAL | 1 refills | Status: DC

## 2022-08-09 NOTE — Telephone Encounter (Signed)
Left message for patient to call back and schedule Medicare Annual Wellness Visit (AWV) either virtually or in office. Left  my Bobby Griffith number [phone]   *DUE 06/28/2009 AWVI PER [place] please schedule with Nurse Health Adviser   45 min for awv-i  in office appointments 30 min for awv-s & awv-i phone/virtual appointments

## 2022-08-09 NOTE — Telephone Encounter (Signed)
Patient's daughter notified of message and will notify her father to pick up new Rx and to continue to take his B/P daily.

## 2022-08-18 ENCOUNTER — Encounter: Payer: Self-pay | Admitting: Nurse Practitioner

## 2022-08-18 ENCOUNTER — Ambulatory Visit (INDEPENDENT_AMBULATORY_CARE_PROVIDER_SITE_OTHER): Payer: 59 | Admitting: Nurse Practitioner

## 2022-08-18 VITALS — BP 126/78 | HR 88 | Temp 97.7°F | Ht 69.0 in | Wt 157.0 lb

## 2022-08-18 DIAGNOSIS — I10 Essential (primary) hypertension: Secondary | ICD-10-CM

## 2022-08-18 DIAGNOSIS — R7301 Impaired fasting glucose: Secondary | ICD-10-CM | POA: Diagnosis not present

## 2022-08-18 DIAGNOSIS — K0889 Other specified disorders of teeth and supporting structures: Secondary | ICD-10-CM

## 2022-08-18 LAB — HEMOGLOBIN A1C: Hgb A1c MFr Bld: 5.8 % (ref 4.6–6.5)

## 2022-08-18 NOTE — Assessment & Plan Note (Signed)
Chronic, stable.  Blood pressure has improved and is 126/78 today.  Continue losartan 25 mg daily.  Check BMP today.  We had a long discussion about sodium content in food and how he should limit to less than 2000 mg daily.  He would also like to talk with a nutritionist, referral placed today.  Follow-up in 3 months.

## 2022-08-18 NOTE — Patient Instructions (Addendum)
It was great to see you!  Keep taking your losartan 1 tablet daily.   I have placed a referral to a nutritionist, they will call.   Let's follow-up in 3 months, sooner if you have concerns.  If a referral was placed today, you will be contacted for an appointment. Please note that routine referrals can sometimes take up to 3-4 weeks to process. Please call our office if you haven't heard anything after this time frame.  Take care,  Vance Peper, NP

## 2022-08-18 NOTE — Progress Notes (Signed)
Acute Office Visit  Subjective:     Patient ID: Bobby Griffith., male    DOB: 1951-03-30, 72 y.o.   MRN: [id number]  Chief Complaint  Patient presents with   Hypertension    Follow up with HTN,  concerned with blood in stool one time on 08/16/22, dental referral    HPI Patient is in today for follow-up on hypertension.  He has started taking his losartan 25 mg daily.  He is having some dizziness, however he thinks that may be related to the tooth pain he is having as well.  He does not have a dentist here and needs to be established somewhere.  He denies chest pain and shortness of breath.  He has noticed something red in his stool a few days ago, however he states that he ate spaghetti and thinks it might have been a tomato.  He has not had any issues since.  He denies constipation, diarrhea, abdominal pain.  ROS See pertinent positives and negatives per HPI.     Objective:    BP 126/78 (BP Location: Left Arm, Cuff Size: Normal)   Pulse 88   Temp 97.7 F (36.5 C)   Ht 5' 9"$  (1.753 m)   Wt 157 lb (71.2 kg)   SpO2 98%   BMI 23.18 kg/m    Physical Exam Vitals and nursing note reviewed.  Constitutional:      Appearance: Normal appearance.  HENT:     Head: Normocephalic.  Eyes:     Conjunctiva/sclera: Conjunctivae normal.  Cardiovascular:     Rate and Rhythm: Normal rate and regular rhythm.     Pulses: Normal pulses.     Heart sounds: Normal heart sounds.  Pulmonary:     Effort: Pulmonary effort is normal.     Breath sounds: Normal breath sounds.  Musculoskeletal:     Cervical back: Normal range of motion.  Skin:    General: Skin is warm.  Neurological:     General: No focal deficit present.     Mental Status: He is alert and oriented to person, place, and time.  Psychiatric:        Mood and Affect: Mood normal.        Behavior: Behavior normal.        Thought Content: Thought content normal.        Judgment: Judgment normal.      Assessment & Plan:    Problem List Items Addressed This Visit       Cardiovascular and Mediastinum   Primary hypertension - Primary    Chronic, stable.  Blood pressure has improved and is 126/78 today.  Continue losartan 25 mg daily.  Check BMP today.  We had a long discussion about sodium content in food and how he should limit to less than 2000 mg daily.  He would also like to talk with a nutritionist, referral placed today.  Follow-up in 3 months.      Relevant Orders   Amb ref to Medical Nutrition Therapy-MNT   Basic Metabolic Panel (BMET)   Other Visit Diagnoses     Tooth pain       Referral placed to dentist.  If we are unable to get him scheduled somewhere, he will need help calling to establish.   Relevant Orders   Ambulatory referral to Dentistry   IFG (impaired fasting glucose)       Glucose has been elevated in the past, will check A1c today.   Relevant Orders  Hemoglobin A1c       No orders of the defined types were placed in this encounter.   Return in about 3 months (around 11/16/2022) for HTN.  Charyl Dancer, NP

## 2022-08-19 LAB — BASIC METABOLIC PANEL
BUN: 16 mg/dL (ref 6–23)
CO2: 26 mEq/L (ref 19–32)
Calcium: 9.8 mg/dL (ref 8.4–10.5)
Chloride: 103 mEq/L (ref 96–112)
Creatinine, Ser: 1.11 mg/dL (ref 0.40–1.50)
GFR: 66.86 mL/min (ref >60.00)
Glucose, Bld: 101 mg/dL — ABNORMAL HIGH (ref 70–99)
Potassium: 4.2 mEq/L (ref 3.5–5.1)
Sodium: 138 mEq/L (ref 135–145)

## 2022-09-01 ENCOUNTER — Ambulatory Visit: Payer: 59 | Admitting: Nurse Practitioner

## 2022-09-05 ENCOUNTER — Encounter (HOSPITAL_COMMUNITY): Payer: Self-pay | Admitting: *Deleted

## 2022-09-05 ENCOUNTER — Inpatient Hospital Stay (HOSPITAL_COMMUNITY)
Admission: EM | Admit: 2022-09-05 | Discharge: 2022-09-07 | DRG: 392 | Disposition: A | Discharge status: Home / Self Care | Payer: 59 | Attending: Internal Medicine | Admitting: Internal Medicine

## 2022-09-05 ENCOUNTER — Observation Stay (HOSPITAL_COMMUNITY): Payer: 59

## 2022-09-05 ENCOUNTER — Other Ambulatory Visit: Payer: Self-pay

## 2022-09-05 DIAGNOSIS — R197 Diarrhea, unspecified: Secondary | ICD-10-CM | POA: Diagnosis not present

## 2022-09-05 DIAGNOSIS — Z1211 Encounter for screening for malignant neoplasm of colon: Secondary | ICD-10-CM

## 2022-09-05 DIAGNOSIS — K921 Melena: Principal | ICD-10-CM

## 2022-09-05 DIAGNOSIS — Z79899 Other long term (current) drug therapy: Secondary | ICD-10-CM | POA: Diagnosis not present

## 2022-09-05 DIAGNOSIS — K219 Gastro-esophageal reflux disease without esophagitis: Secondary | ICD-10-CM | POA: Diagnosis not present

## 2022-09-05 DIAGNOSIS — A0839 Other viral enteritis: Secondary | ICD-10-CM | POA: Diagnosis not present

## 2022-09-05 DIAGNOSIS — A0811 Acute gastroenteropathy due to Norwalk agent: Secondary | ICD-10-CM | POA: Diagnosis not present

## 2022-09-05 DIAGNOSIS — H9190 Unspecified hearing loss, unspecified ear: Secondary | ICD-10-CM | POA: Diagnosis present

## 2022-09-05 DIAGNOSIS — K922 Gastrointestinal hemorrhage, unspecified: Secondary | ICD-10-CM | POA: Diagnosis not present

## 2022-09-05 DIAGNOSIS — A084 Viral intestinal infection, unspecified: Secondary | ICD-10-CM | POA: Diagnosis present

## 2022-09-05 DIAGNOSIS — Z603 Acculturation difficulty: Secondary | ICD-10-CM | POA: Diagnosis present

## 2022-09-05 DIAGNOSIS — I10 Essential (primary) hypertension: Secondary | ICD-10-CM | POA: Diagnosis present

## 2022-09-05 DIAGNOSIS — N281 Cyst of kidney, acquired: Secondary | ICD-10-CM | POA: Diagnosis not present

## 2022-09-05 DIAGNOSIS — A059 Bacterial foodborne intoxication, unspecified: Secondary | ICD-10-CM | POA: Diagnosis not present

## 2022-09-05 DIAGNOSIS — I700 Atherosclerosis of aorta: Secondary | ICD-10-CM | POA: Diagnosis not present

## 2022-09-05 DIAGNOSIS — K625 Hemorrhage of anus and rectum: Secondary | ICD-10-CM | POA: Diagnosis not present

## 2022-09-05 DIAGNOSIS — A0831 Calicivirus enteritis: Secondary | ICD-10-CM | POA: Diagnosis not present

## 2022-09-05 DIAGNOSIS — E785 Hyperlipidemia, unspecified: Secondary | ICD-10-CM | POA: Diagnosis present

## 2022-09-05 DIAGNOSIS — K55059 Acute (reversible) ischemia of intestine, part and extent unspecified: Secondary | ICD-10-CM | POA: Diagnosis not present

## 2022-09-05 DIAGNOSIS — A09 Infectious gastroenteritis and colitis, unspecified: Secondary | ICD-10-CM | POA: Diagnosis not present

## 2022-09-05 LAB — HEMOGLOBIN AND HEMATOCRIT, BLOOD
HCT: 44.4 % (ref 39.0–52.0)
HCT: 45.4 % (ref 39.0–52.0)
Hemoglobin: 14.8 g/dL (ref 13.0–17.0)
Hemoglobin: 15.2 g/dL (ref 13.0–17.0)

## 2022-09-05 LAB — C DIFFICILE QUICK SCREEN W PCR REFLEX
C Diff antigen: NEGATIVE
C Diff interpretation: NOT DETECTED
C Diff toxin: NEGATIVE

## 2022-09-05 LAB — COMPREHENSIVE METABOLIC PANEL
ALT: 26 U/L (ref 0–44)
AST: 23 U/L (ref 15–41)
Albumin: 3.8 g/dL (ref 3.5–5.0)
Alkaline Phosphatase: 64 U/L (ref 38–126)
Anion gap: 6 (ref 5–15)
BUN: 13 mg/dL (ref 8–23)
CO2: 26 mmol/L (ref 22–32)
Calcium: 8.8 mg/dL — ABNORMAL LOW (ref 8.9–10.3)
Chloride: 106 mmol/L (ref 98–111)
Creatinine, Ser: 1.07 mg/dL (ref 0.61–1.24)
GFR, Estimated: >60 mL/min (ref >60)
Glucose, Bld: 111 mg/dL — ABNORMAL HIGH (ref 70–99)
Potassium: 3.9 mmol/L (ref 3.5–5.1)
Sodium: 138 mmol/L (ref 135–145)
Total Bilirubin: 1.2 mg/dL (ref 0.3–1.2)
Total Protein: 7.4 g/dL (ref 6.5–8.1)

## 2022-09-05 LAB — TYPE AND SCREEN
ABO/RH(D): AB POS
Antibody Screen: NEGATIVE

## 2022-09-05 LAB — LIPASE, BLOOD: Lipase: 39 U/L (ref 11–51)

## 2022-09-05 LAB — URINALYSIS, ROUTINE W REFLEX MICROSCOPIC
Bilirubin Urine: NEGATIVE
Glucose, UA: NEGATIVE mg/dL
Hgb urine dipstick: NEGATIVE
Ketones, ur: NEGATIVE mg/dL
Leukocytes,Ua: NEGATIVE
Nitrite: NEGATIVE
Protein, ur: NEGATIVE mg/dL
Specific Gravity, Urine: 1.021 (ref 1.005–1.030)
pH: 5 (ref 5.0–8.0)

## 2022-09-05 LAB — CBC
HCT: 46.3 % (ref 39.0–52.0)
Hemoglobin: 15.5 g/dL (ref 13.0–17.0)
MCH: 31.6 pg (ref 26.0–34.0)
MCHC: 33.5 g/dL (ref 30.0–36.0)
MCV: 94.3 fL (ref 80.0–100.0)
Platelets: 253 10*3/uL (ref 150–400)
RBC: 4.91 MIL/uL (ref 4.22–5.81)
RDW: 13.5 % (ref 11.5–15.5)
WBC: 3.8 10*3/uL — ABNORMAL LOW (ref 4.0–10.5)
nRBC: 0 % (ref 0.0–0.2)

## 2022-09-05 LAB — BASIC METABOLIC PANEL WITH GFR
Anion gap: 9 (ref 5–15)
BUN: 11 mg/dL (ref 8–23)
CO2: 20 mmol/L — ABNORMAL LOW (ref 22–32)
Calcium: 8.8 mg/dL — ABNORMAL LOW (ref 8.9–10.3)
Chloride: 107 mmol/L (ref 98–111)
Creatinine, Ser: 0.94 mg/dL (ref 0.61–1.24)
GFR, Estimated: >60 mL/min
Glucose, Bld: 80 mg/dL (ref 70–99)
Potassium: 4 mmol/L (ref 3.5–5.1)
Sodium: 136 mmol/L (ref 135–145)

## 2022-09-05 LAB — ABO/RH: ABO/RH(D): AB POS

## 2022-09-05 LAB — POC OCCULT BLOOD, ED: Fecal Occult Bld: POSITIVE — AB

## 2022-09-05 LAB — HEPATITIS B SURFACE ANTIBODY,QUALITATIVE: Hep B S Ab: REACTIVE — AB

## 2022-09-05 MED ORDER — ACETAMINOPHEN 325 MG PO TABS: 650.0000 mg | ORAL_TABLET | Freq: Four times a day (QID) | ORAL | Status: DC | PRN

## 2022-09-05 MED ORDER — ACETAMINOPHEN 650 MG RE SUPP: 650.0000 mg | Freq: Four times a day (QID) | RECTAL | Status: DC | PRN

## 2022-09-05 MED ORDER — PANTOPRAZOLE SODIUM 40 MG PO TBEC
40.0000 mg | DELAYED_RELEASE_TABLET | Freq: Two times a day (BID) | ORAL | Status: DC
  Administered 2022-09-05 – 2022-09-07 (×5): 40 mg via ORAL
  Filled 2022-09-05 (×5): qty 1

## 2022-09-05 MED ORDER — DICYCLOMINE HCL 10 MG PO CAPS
10.0000 mg | ORAL_CAPSULE | Freq: Three times a day (TID) | ORAL | Status: DC
  Administered 2022-09-05 – 2022-09-07 (×7): 10 mg via ORAL
  Filled 2022-09-05 (×7): qty 1

## 2022-09-05 MED ORDER — IOHEXOL 350 MG/ML SOLN
85.0000 mL | Freq: Once | INTRAVENOUS | Status: AC | PRN
  Administered 2022-09-05: 85 mL via INTRAVENOUS

## 2022-09-05 MED ORDER — POTASSIUM CHLORIDE IN NACL 20-0.9 MEQ/L-% IV SOLN
INTRAVENOUS | Status: DC
  Filled 2022-09-05 (×6): qty 1000

## 2022-09-05 MED ORDER — ONDANSETRON HCL 4 MG PO TABS: 4.0000 mg | ORAL_TABLET | Freq: Four times a day (QID) | ORAL | Status: DC | PRN

## 2022-09-05 MED ORDER — ONDANSETRON HCL 4 MG/2ML IJ SOLN: 4.0000 mg | Freq: Four times a day (QID) | INTRAMUSCULAR | Status: DC | PRN

## 2022-09-05 MED ORDER — DICYCLOMINE HCL 10 MG PO CAPS: 10.0000 mg | ORAL_CAPSULE | Freq: Three times a day (TID) | ORAL | Status: DC | PRN

## 2022-09-05 MED ORDER — LOSARTAN POTASSIUM 25 MG PO TABS
25.0000 mg | ORAL_TABLET | Freq: Every day | ORAL | Status: DC
  Administered 2022-09-05 – 2022-09-07 (×3): 25 mg via ORAL
  Filled 2022-09-05 (×3): qty 1

## 2022-09-05 MED ORDER — DICYCLOMINE HCL 10 MG PO CAPS
10.0000 mg | ORAL_CAPSULE | Freq: Once | ORAL | Status: AC
  Administered 2022-09-05: 10 mg via ORAL
  Filled 2022-09-05: qty 1

## 2022-09-05 NOTE — ED Triage Notes (Signed)
The pt is c/o rectal bleeding today and abd pain   he is deaf  he has his phone and family can intrepret for him

## 2022-09-05 NOTE — ED Provider Notes (Signed)
[facility] Provider Note   CSN: [id number] Arrival date & time: 09/05/22  0122     History  Chief Complaint  Patient presents with   Abdominal Pain   Rectal Bleeding    Bobby Griffith. is a 72 y.o. male.  72 year old male presents to the emergency department for evaluation of rectal bleeding.  He initially noted hematochezia on Monday when having a bowel movement.  Blood was mixed with brown stool and recurred again on Wednesday as well as yesterday, Saturday.  He reports some abdominal cramping prior to a bowel movement.  Abdominal discomfort is periumbilical and nonradiating.  He denies any known modifying factors of his symptoms.  Has not tried any medications for his rectal bleeding or his pain.  Denies use of chronic anticoagulation.  He cannot recall the date of his last colonoscopy, but was advised to schedule one at his last PCP visit a month ago.  Denies fevers, melena, vomiting, constipation, urinary symptoms.  No prior abdominal surgeries.   The history is provided by the patient. A language interpreter was used (video interpreter).  Abdominal Pain Associated symptoms: hematochezia   Rectal Bleeding Associated symptoms: abdominal pain        Home Medications Prior to Admission medications   Medication Sig Start Date End Date Taking? Authorizing Provider  losartan (COZAAR) 25 MG tablet Take 1 tablet (25 mg total) by mouth daily. 08/09/22   McElwee, Scheryl Darter, NP      Allergies    Patient has no known allergies.    Review of Systems   Review of Systems  Gastrointestinal:  Positive for abdominal pain and hematochezia.  Ten systems reviewed and are negative for acute change, except as noted in the HPI.    Physical Exam Updated Vital Signs BP (!) 143/92   Pulse 70   Temp 97.8 F (36.6 C) (Oral)   Resp 16   Ht '5\' 9"'$  (1.753 m)   SpO2 100%   BMI 23.18 kg/m   Physical Exam Vitals and nursing note reviewed.   Constitutional:      General: He is not in acute distress.    Appearance: He is well-developed. He is not diaphoretic.     Comments: Nontoxic appearing and in NAD  HENT:     Head: Normocephalic and atraumatic.  Eyes:     General: No scleral icterus.    Conjunctiva/sclera: Conjunctivae normal.  Cardiovascular:     Rate and Rhythm: Normal rate and regular rhythm.     Pulses: Normal pulses.  Pulmonary:     Effort: Pulmonary effort is normal. No respiratory distress.     Comments: Respirations even and unlabored. Genitourinary:    Comments: Scant pinkish mucous on DRE, chaperoned by RN. No hemorrhoids or anal fissure noted. Musculoskeletal:        General: Normal range of motion.     Cervical back: Normal range of motion.  Skin:    General: Skin is warm and dry.     Coloration: Skin is not pale.     Findings: No erythema or rash.  Neurological:     Mental Status: He is alert and oriented to person, place, and time.  Psychiatric:        Behavior: Behavior normal.     ED Results / Procedures / Treatments   Labs (all labs ordered are listed, but only abnormal results are displayed) Labs Reviewed  COMPREHENSIVE METABOLIC PANEL - Abnormal; Notable for the following  components:      Result Value   Glucose, Bld 111 (*)    Calcium 8.8 (*)    All other components within normal limits  CBC - Abnormal; Notable for the following components:   WBC 3.8 (*)    All other components within normal limits  POC OCCULT BLOOD, ED - Abnormal; Notable for the following components:   Fecal Occult Bld POSITIVE (*)    All other components within normal limits  C DIFFICILE QUICK SCREEN W PCR REFLEX    URINALYSIS, ROUTINE W REFLEX MICROSCOPIC  LIPASE, BLOOD  NOROVIRUS GROUP 1 & 2 BY PCR, STOOL  HEMOGLOBIN AND HEMATOCRIT, BLOOD  HEMOGLOBIN AND HEMATOCRIT, BLOOD  TYPE AND SCREEN  ABO/RH    EKG None  Radiology No results found.  Procedures Procedures    Medications Ordered in  ED Medications  dicyclomine (BENTYL) capsule 10 mg (has no administration in time range)  0.9 % NaCl with KCl 20 mEq/ L  infusion (has no administration in time range)  dicyclomine (BENTYL) capsule 10 mg (10 mg Oral Given 09/05/22 0343)    ED Course/ Medical Decision Making/ A&P Clinical Course as of 09/05/22 0546  Sun Sep 05, 2022  0545 Dr. Claria Dice of [facility] has seen the patient in the ED for admission. [KH]    Clinical Course User Index [KH] Antonietta Breach, PA-C                             Medical Decision Making Amount and/or Complexity of Data Reviewed Labs: ordered.  Risk Prescription drug management. Decision regarding hospitalization.   This patient presents to the ED for concern of hematochezia, this involves an extensive number of treatment options, and is a complaint that carries with it a high risk of complications and morbidity.  The differential diagnosis includes hemorrhoid/fissure vs diverticular bleed vs IBD   Co morbidities that complicate the patient evaluation  HTN Deaf   Additional history obtained:  External records from outside source obtained and reviewed including PCP visit from 08/04/22 advising patient to schedule routine colonoscopy   Lab Tests:  I Ordered, and personally interpreted labs.  The pertinent results include:  Positive hemoccult. WBC 3.8. H/H 15.5/46.3 (normal).   Cardiac Monitoring:  The patient was maintained on a cardiac monitor.  I personally viewed and interpreted the cardiac monitored which showed an underlying rhythm of: NSR   Medicines ordered and prescription drug management:  I ordered medication including Bentyl for abdominal cramping  Reevaluation of the patient after these medicines showed that the patient improved I have reviewed the patients home medicines and have made adjustments as needed   Test Considered:  Tagged RBC scan   Consultations Obtained:  I requested consultation with Dr. Claria Dice of [facility] and  discussed lab and imaging findings as well as pertinent plan - they have assessed the patient in the ED for admission. Dr. Havery Moros of [facility] notified via epic secure chat of the need for inpatient consultation   Problem List / ED Course:  As above   Reevaluation:  After the interventions noted above, I reevaluated the patient and found that they have :stayed the same   Social Determinants of Health:  Hearing impaired; deaf   Dispostion:  After consideration of the diagnostic results and the patients response to treatment, I feel that the patent would benefit from admission for further evaluation of suspected lower GI bleed.  No evidence of hemorrhoids or  anal fissure on exam.  The patient has a stable hemoglobin compared to baseline.  No hypotension or tachycardia to suggest acute or emergent blood loss.  The patient is not on chronic anticoagulation.  Will be admitted for observation and serial CBC trending.  Requested inpatient consultation by [facility] gastroenterology.  TRH to admit.         Final Clinical Impression(s) / ED Diagnoses Final diagnoses:  Hematochezia  Lower GI bleed    Rx / DC Orders ED Discharge Orders     None         Antonietta Breach, PA-C 09/05/22 0551    Merrily Pew, MD 09/05/22 [phone]

## 2022-09-05 NOTE — Plan of Care (Signed)

## 2022-09-05 NOTE — H&P (Signed)
PCP:   Charyl Dancer, NP   Chief Complaint:  GI bleed  HPI: This is a 72 year old male with past medical history of hypertension and deafness.  He presents with complaint of GI bleed.  Per patient on Monday, Wednesday and again on Saturday he had abdominal cramping, followed by diarrhea with big splats of blood, BRBPR.  He reports feeling sick, nauseous and associated abdominal cramping.  He denies headedness, dizziness, constipation or rectal pain.  He denies history of rectal bleeding or hemorrhoids.  He's had a colonoscopy.  He denies NSAID use and does not eat spicy foods.  He endorses GERD, which when present is persistent and interrupts his sleep.  However, he states his GERD is not worsening and he has not recently had heartburn symptoms.  He denies weight loss, lethargy or appetite change.  In the ER patient's occult stool positive (done by EDP).  Per EDP report there is no hemorrhoids or fissures noted on examination.  Hemoglobin 15.5, stable.  Dr. Havery Moros per GI contacted via epic chat.  Patient is deaf, interpreter line used  Review of Systems:  The patient denies anorexia, fever, weight loss, vision loss, decreased hearing, hoarseness, chest pain, syncope, dyspnea on exertion, peripheral edema, balance deficits, hemoptysis, hematochezia, hematuria, incontinence, genital sores, muscle weakness, suspicious skin lesions, transient blindness, difficulty walking, depression, unusual weight change, abnormal bleeding, enlarged lymph nodes, angioedema, and breast masses. Positive: Abdominal pain, BRBPR, nausea  Past Medical History: Past Medical History:  Diagnosis Date   Deaf, bilateral    HTN (hypertension)    Past Surgical History:  Procedure Laterality Date   ABDOMINAL SURGERY     LEFT HEART CATH AND CORONARY ANGIOGRAPHY N/A 02/23/2018   Procedure: LEFT HEART CATH AND CORONARY ANGIOGRAPHY;  Surgeon: Burnell Blanks, MD;  Location: [facility];  Service:  Cardiovascular;  Laterality: N/A;    Medications: Prior to Admission medications   Medication Sig Start Date End Date Taking? Authorizing Provider  losartan (COZAAR) 25 MG tablet Take 1 tablet (25 mg total) by mouth daily. 08/09/22   McElwee, Scheryl Darter, NP    Allergies:  No Known Allergies  Social History:  reports that he has never smoked. He has never used smokeless tobacco. He reports current alcohol use. He reports that he does not use drugs.  Family History: History reviewed. No pertinent family history.  Physical Exam: Vitals:   09/05/22 0345 09/05/22 0439 09/05/22 0440 09/05/22 0500  BP: (!) 154/95 (!) 150/92 (!) 150/92 (!) 147/83  Pulse: 66 65 65 66  Resp:   16   Temp:  97.8 F (36.6 C) 97.8 F (36.6 C)   TempSrc:  Oral Oral   SpO2: 100% 100% 100% 99%  Height:        General:  Alert and oriented times three, well developed and nourished, no acute distress Eyes: PERRLA, pink conjunctiva, no scleral icterus ENT: Moist oral mucosa, neck supple, no thyromegaly Lungs: clear to ascultation, no wheeze, no crackles, no use of accessory muscles Cardiovascular: regular rate and rhythm, no regurgitation, no gallops, no murmurs. No carotid bruits, no JVD Abdomen: soft, positive BS, non-tender, non-distended, no organomegaly, not an acute abdomen GU: not examined Neuro: CN II - XII grossly intact, sensation intact Musculoskeletal: strength 5/5 all extremities, no clubbing, cyanosis or edema Skin: no rash, no subcutaneous crepitation, no decubitus Psych: appropriate patient   Labs on Admission:  Recent Labs    09/05/22 0145  NA 138  K 3.9  CL  106  CO2 26  GLUCOSE 111*  BUN 13  CREATININE 1.07  CALCIUM 8.8*   Recent Labs    09/05/22 0145  AST 23  ALT 26  ALKPHOS 64  BILITOT 1.2  PROT 7.4  ALBUMIN 3.8   Recent Labs    09/05/22 0145  LIPASE 39   Recent Labs    09/05/22 0145  WBC 3.8*  HGB 15.5  HCT 46.3  MCV 94.3  PLT 253    Radiological Exams  on Admission: No results found.  Assessment/Plan Present on Admission:  GI bleed -Serial H&H's -N.p.o., IV fluid hydration -GI consult to [facility] placed -CEA ordered -Single dose IV Protonix   Diarrhea -C. difficile/GI panel ordered -Bentyl as needed muscle cramps   HTN -Losartan resumed   Electra Paladino 09/05/2022, 5:19 AM

## 2022-09-05 NOTE — ED Notes (Signed)
Vital signs stable. 

## 2022-09-05 NOTE — ED Notes (Signed)
ED TO INPATIENT HANDOFF REPORT  ED Nurse Name and Phone #: Janiya Millirons  S Name/Age/Gender [name]. 72 y.o. male Room/Bed: 028C/028C  Code Status   Code Status: Prior  Home/SNF/Other Home Patient oriented to: self, place, time, and situation Is this baseline? Yes   Triage Complete: Triage complete  Chief Complaint GI bleed [K92.2]  Triage Note The pt is c/o rectal bleeding today and abd pain   he is deaf  he has his phone and family can intrepret for him   Allergies No Known Allergies  Level of Care/Admitting Diagnosis ED Disposition     ED Disposition  [place]: [facility] [[id number]]  Level of Care: Med-Surg [16]  May place patient in observation at [facility] or [facility] if equivalent level of care is available:: Yes  Covid Evaluation: Confirmed COVID Negative  Diagnosis: GI bleed [id number]  Admitting Physician: Cutter, [place]  Attending Physician: Quintella Baton [[id number]]          B Medical/Surgery History Past Medical History:  Diagnosis Date   Deaf, bilateral    HTN (hypertension)    Past Surgical History:  Procedure Laterality Date   ABDOMINAL SURGERY     LEFT HEART CATH AND CORONARY ANGIOGRAPHY N/A 02/23/2018   Procedure: LEFT HEART CATH AND CORONARY ANGIOGRAPHY;  Surgeon: Burnell Blanks, MD;  Location: [facility];  Service: Cardiovascular;  Laterality: N/A;     A IV Location/Drains/Wounds Patient Lines/Drains/Airways Status     Active Line/Drains/Airways     Name Placement date Placement time Site Days   Peripheral IV 09/05/22 20 G Anterior;Proximal;Right Forearm 09/05/22  0315  Forearm  less than 1            Intake/Output Last 24 hours No intake or output data in the 24 hours ending 09/05/22 0541  Labs/Imaging Results for orders placed or performed during the hospital encounter of 09/05/22 (from the past 48 hour(s))  Type and screen [place]     Status: None   Collection Time: 09/05/22  1:30 AM  Result Value Ref Range   ABO/RH(D) AB POS    Antibody Screen NEG    Sample Expiration      09/08/2022,2359 Performed at [facility], [place] [address], [place], [place] [address]   ABO/Rh     Status: None   Collection Time: 09/05/22  1:40 AM  Result Value Ref Range   ABO/RH(D)      AB POS Performed at [facility] [address] [place], [place] [address]   Comprehensive metabolic panel     Status: Abnormal   Collection Time: 09/05/22  1:45 AM  Result Value Ref Range   Sodium 138 135 - 145 mmol/L   Potassium 3.9 3.5 - 5.1 mmol/L   [place] 106 98 - 111 mmol/L   CO2 26 22 - 32 mmol/L   Glucose, Bld 111 (H) 70 - 99 mg/dL    Comment: Glucose reference range applies only to samples taken after fasting for at least 8 hours.   BUN 13 8 - 23 mg/dL   Creatinine, Ser 1.07 0.61 - 1.24 mg/dL   Calcium 8.8 (L) 8.9 - 10.3 mg/dL   Total Protein 7.4 6.5 - 8.1 g/dL   Albumin 3.8 3.5 - 5.0 g/dL   AST 23 15 - 41 U/L   ALT 26 0 - 44 U/L  Alkaline Phosphatase 64 38 - 126 U/L   Total Bilirubin 1.2 0.3 - 1.2 mg/dL   GFR, Estimated >60 >60 mL/min    Comment: (NOTE) Calculated using the CKD-EPI Creatinine Equation (2021)    Anion gap 6 5 - 15    Comment: Performed at [facility] [address] [place], [place] [address]  CBC     Status: Abnormal   Collection Time: 09/05/22  1:45 AM  Result Value Ref Range   WBC 3.8 (L) 4.0 - 10.5 K/uL   RBC 4.91 4.22 - 5.81 MIL/uL   Hemoglobin 15.5 13.0 - 17.0 g/dL   HCT 46.3 39.0 - 52.0 %   MCV 94.3 80.0 - 100.0 fL   MCH 31.6 26.0 - 34.0 pg   MCHC 33.5 30.0 - 36.0 g/dL   RDW 13.5 11.5 - 15.5 %   Platelets 253 150 - 400 K/uL   nRBC 0.0 0.0 - 0.2 %    Comment: Performed at [facility], [place] [address], [place], [place] [address]  Lipase, blood     Status: None   Collection Time: 09/05/22  1:45 AM  Result Value Ref Range   Lipase 39 11 - 51 U/L     Comment: Performed at [facility] [address] [place], [place] [address]  Urinalysis, Routine w reflex microscopic -Urine, Clean Catch     Status: None   Collection Time: 09/05/22  1:47 AM  Result Value Ref Range   Color, Urine YELLOW YELLOW   APPearance CLEAR CLEAR   Specific Gravity, Urine 1.021 1.005 - 1.030   pH 5.0 5.0 - 8.0   Glucose, UA NEGATIVE NEGATIVE mg/dL   Hgb urine dipstick NEGATIVE NEGATIVE   Bilirubin Urine NEGATIVE NEGATIVE   Ketones, ur NEGATIVE NEGATIVE mg/dL   Protein, ur NEGATIVE NEGATIVE mg/dL   Nitrite NEGATIVE NEGATIVE   Leukocytes,Ua NEGATIVE NEGATIVE    Comment: Performed at [facility] [address], [place], [place] [address]  POC occult blood, ED     Status: Abnormal   Collection Time: 09/05/22  3:17 AM  Result Value Ref Range   Fecal Occult Bld POSITIVE (A) NEGATIVE   No results found.  Pending Labs Unresulted Labs (From admission, onward)    None       Vitals/Pain Today's Vitals   09/05/22 0440 09/05/22 0500 09/05/22 0515 09/05/22 0530  BP: (!) 150/92 (!) 147/83 133/86 (!) 143/92  Pulse: 65 66 69 70  Resp: 16     Temp: 97.8 F (36.6 C)     TempSrc: Oral     SpO2: 100% 99% 98% 100%  Height:      PainSc:        Isolation Precautions No active isolations  Medications Medications  dicyclomine (BENTYL) capsule 10 mg (10 mg Oral Given 09/05/22 0343)    Mobility walks     Focused Assessments Cardiac Assessment Handoff:    Lab Results  Component Value Date   TROPONINI <0.03 02/21/2018   Lab Results  Component Value Date   DDIMER 0.35 02/21/2018   Does the Patient currently have chest pain? No    R Recommendations: See Admitting Provider Note  Report given to:   Additional Notes:

## 2022-09-05 NOTE — Progress Notes (Signed)
[facility] Progress Note  Patient: Bobby Griffith.     [id number]  DOA: 09/05/2022   PCP: Charyl Dancer, NP       Brief hospital course: 72 y/o with HTN who is deaf presents to the hospital for abdominal cramping and diarrhea with blood.   Subjective:  He had 2 BMs with blood.  Assessment and Plan: Principal Problem:   GI bleed/ Hematochezia-   Diarrhea - heme +- has blood clots on both BMs today - f/u stool studies - appreciate GI consult- Bentyl added  - Hgb 15.5 > 14.8  Active Problems:  HTN - Losartan     Code Status: Full Code Consultants: GI Level of Care: Level of care: Med-Surg Total time on patient care: 35 min DVT prophylaxis:  SCDs Start: 09/05/22 0627    Objective:   Vitals:   09/05/22 0630 09/05/22 0750 09/05/22 0814 09/05/22 1723  BP:  133/80  131/74  Pulse: 74 67  69  Resp:  16  17  Temp:  (!) 97.4 F (36.3 C)  97.8 F (36.6 C)  TempSrc:  Oral    SpO2: 100% 100%  99%  Weight:   71.5 kg   Height:   '5\' 9"'$  (1.753 m)    Filed Weights   09/05/22 0814  Weight: 71.5 kg   Exam: General exam: Appears comfortable  HEENT: oral mucosa moist Respiratory system: Clear to auscultation.  Cardiovascular system: S1 & S2 heard  Gastrointestinal system: Abdomen soft, non-tender, nondistended. Normal bowel sounds   Extremities: No cyanosis, clubbing or edema Psychiatry:  Mood & affect appropriate.      CBC: Recent Labs  Lab 09/05/22 0145 09/05/22 1005  WBC 3.8*  --   HGB 15.5 14.8  HCT 46.3 44.4  MCV 94.3  --   PLT 253  --    Basic Metabolic Panel: Recent Labs  Lab 09/05/22 0145  NA 138  K 3.9  CL 106  CO2 26  GLUCOSE 111*  BUN 13  CREATININE 1.07  CALCIUM 8.8*   GFR: Estimated Creatinine Clearance: 63.3 mL/min (by C-G formula based on SCr of 1.07 mg/dL).  Scheduled Meds:  dicyclomine  10 mg Oral Q8H   losartan  25 mg Oral Daily   pantoprazole  40 mg Oral BID   Continuous Infusions:  0.9 % NaCl with KCl  20 mEq / L 75 mL/hr at 09/05/22 [id number]   Imaging and lab data was personally reviewed CT ANGIO GI BLEED  Result Date: 09/05/2022 CLINICAL DATA:  Mesenteric ischemia, acute EXAM: CTA ABDOMEN AND PELVIS WITHOUT AND WITH CONTRAST TECHNIQUE: Multidetector CT imaging of the abdomen and pelvis was performed using the standard protocol during bolus administration of intravenous contrast. Multiplanar reconstructed images and MIPs were obtained and reviewed to evaluate the vascular anatomy. RADIATION DOSE REDUCTION: This exam was performed according to the departmental dose-optimization program which includes automated exposure control, adjustment of the mA and/or kV according to patient size and/or use of iterative reconstruction technique. CONTRAST:  31m OMNIPAQUE IOHEXOL 350 MG/ML SOLN COMPARISON:  CTA PE, 04/11/2022.  Chest XR, 06/11/2022. FINDINGS: Suboptimal evaluation, secondary to motion degradation. VASCULAR Aorta: Mild-to-moderate burden of aortoiliac atherosclerosis. Normal caliber aorta without aneurysm, dissection, vasculitis or significant stenosis. Celiac: Patent without evidence of aneurysm, dissection, vasculitis or significant stenosis. SMA: Patent without evidence of aneurysm, dissection, vasculitis or significant stenosis. Renals: Single renal arteries are present. Both renal arteries are patent without evidence of aneurysm, dissection, vasculitis, fibromuscular dysplasia or significant stenosis.  IMA: Patent without evidence of aneurysm, dissection, vasculitis or significant stenosis. Pelvis: Patent without evidence of aneurysm, dissection, vasculitis or significant stenosis. Proximal Outflow: Bilateral common femoral and visualized portions of the superficial and profunda femoral arteries are patent without evidence of aneurysm, dissection, vasculitis or significant stenosis. Veins: No obvious venous abnormality within the limitations of this arterial phase study. Review of the MIP images confirms  the above findings. NON-VASCULAR Lower chest: No acute abnormality. Severe burden multivessel coronary atherosclerosis. Hepatobiliary: No focal liver abnormality is seen. No gallstones, gallbladder wall thickening, or biliary dilatation. Pancreas: No pancreatic ductal dilatation or surrounding inflammatory changes. Spleen: No pancreatic ductal dilatation or surrounding inflammatory changes. Adrenals/Urinary Tract: Adrenal glands are unremarkable. Multiple small RIGHT renal cortical hypodensities, largest a conglomerate at the midpole measuring 2.0 cm however incompletely characterized. Kidneys are otherwise normal, without renal calculi, focal lesion, or hydronephrosis. Bladder is unremarkable. Stomach/Bowel: Stomach is within normal limits. Appendix is not definitively visualized. No evidence of bowel wall thickening, distention, or inflammatory changes. Liquid stool within colon. Lymphatic: No enlarged abdominal or pelvic lymph nodes. Reproductive: Prostate is unremarkable. Other: No abdominal wall hernia or abnormality. No abdominopelvic ascites. Musculoskeletal: Multilevel degenerative changes of the spine. No acute or significant osseous findings. IMPRESSION: VASCULAR 1. Patent mesenteric arteries. No CTA evidence of vascular compromise to bowel. 2. Aortic Atherosclerosis (ICD10-I70.0) and severe burden of coronary atherosclerosis. NON-VASCULAR 1. No acute abdominopelvic process. 2. Liquid stool within the colon, correlate for diarrhea. 3. 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 No follow-up imaging is recommended. JACR 2018 Feb; 264-273, Management of the Incidental Renal Mass on CT, [employer] 2021; 814-848, Bosniak Classification of Cystic Renal Masses, Version 2019. Electronically Signed   By: Michaelle Birks M.D.   On: 09/05/2022 07:40    LOS: 0 days   Author: Debbe Odea  09/05/2022 5:41 PM  To contact [facility]>   Check the care team in [facility] and look for the attending/consulting [place]  provider listed  Log into [email or web address] and use [facility]'s universal password   Go to> "[facility]"  and find provider  If you still have difficulty reaching the provider, please page the [facility] (Director on Call) for the Hospitalists listed on amion

## 2022-09-05 NOTE — Progress Notes (Signed)
New Admission Note:   Arrival Method: Stretcher Mental Orientation: Alert and orientedx4 Telemetry: not ordered Assessment: Completed Skin: intact IV: Right forearm Pain: none Tubes: none Safety Measures: Safety Fall Prevention Plan has been given, discussed and signed Admission: Completed [facility] Orientation: Patient has been orientated to the room, unit and staff.  Family: not at bedside  Special note:  patient is american sign language, ipad with interpretive services in room or outside of room.  Orders have been reviewed and implemented. Will continue to monitor the patient. Call light has been placed within reach and bed alarm has been activated.   Oralia Rud LPN [facility] Phone: [phone]

## 2022-09-05 NOTE — Consult Note (Signed)
Consultation Note   Referring Provider:  Teaching Service PCP: Charyl Dancer, NP Primary Gastroenterologist: Althia Forts     Reason for consultation: Lower Gi bleed   Hospital Day: 1  Assessment   72 year old male with hemodynamically generalized abdominal pain and diarrhea with blood.   CTA  negative  for active bleeding / bowel inflammation.  On DRE there is a scant amount of very light brown malodorous stool without blood.  Rule out infectious colitis.  Hemorrhoidal bleeding?  Hgb is normal. WBC is normal. Abdomen is not significantly tender on exam  Burning in chest. Reflux?   Deafness.  Sign language interpreter via video was used for this vist   Plan   -GI path panel is negative. Will add GI path panel -PO BID PPI for burning in chest -Continue Bentyl but will changed from PRN to scheduled -Consider colonoscopy if stool studies negative. I did explain the risks /benefits of colonoscopy should we need to proceed and he was agreeable to the procedure.    History of Present Illness:  Patient is a 72 y.o. year old male with a past medical history of deafness, GERD, and HTN   see PMH for any additional medical problems.  Patient presented to the ED yesterday morning with abdominal pain and loose stool with blood.  He had loose stool with blood on Monday, Wednesday and again yesterday.  The bleeding was associated with abdominal cramping. He continues to have generalized abdomina pain and also complains of burning in his chest. No dysphagia. No N/V. Prior to Monday he felt okay. Weight has been stable.   In the ED: FOBT positive, hemoglobin 15.5.  Serum calcium low at 8.8/normal albumin of 3.8, normal liver chemistries.  Normal lipase.  CTA abdomen negative for any acute processes or active GI bleeding   Previous GI History / Evaluation :  Colonoscopy "years ago". He cannot remember where it was done   Recent Imaging and  Labs: 09/05/22  CTA ABDOMEN AND PELVIS WITHOUT AND WITH CONTRAST   TECHNIQUE: Multidetector CT imaging of the abdomen and pelvis was performed using the standard protocol during bolus administration of intravenous contrast. Multiplanar reconstructed images and MIPs were obtained and reviewed to evaluate the vascular anatomy.   RADIATION DOSE REDUCTION: This exam was performed according to the departmental dose-optimization program which includes automated exposure control, adjustment of the mA and/or kV according to patient size and/or use of iterative reconstruction technique.   CONTRAST:  51m OMNIPAQUE IOHEXOL 350 MG/ML SOLN   COMPARISON:  CTA PE, 04/11/2022.  Chest XR, 06/11/2022.   FINDINGS: Suboptimal evaluation, secondary to motion degradation.   VASCULAR   Aorta: Mild-to-moderate burden of aortoiliac atherosclerosis. Normal caliber aorta without aneurysm, dissection, vasculitis or significant stenosis.   Celiac: Patent without evidence of aneurysm, dissection, vasculitis or significant stenosis.   SMA: Patent without evidence of aneurysm, dissection, vasculitis or significant stenosis.   Renals: Single renal arteries are present. Both renal arteries are patent without evidence of aneurysm, dissection, vasculitis, fibromuscular dysplasia or significant stenosis.   IMA: Patent without evidence of aneurysm, dissection, vasculitis or significant stenosis.   Pelvis: Patent without evidence of aneurysm, dissection, vasculitis or significant stenosis.   Proximal Outflow: Bilateral common femoral and visualized  portions of the superficial and profunda femoral arteries are patent without evidence of aneurysm, dissection, vasculitis or significant stenosis.   Veins: No obvious venous abnormality within the limitations of this arterial phase study.   Review of the MIP images confirms the above findings.   NON-VASCULAR   Lower chest: No acute abnormality. Severe  burden multivessel coronary atherosclerosis.   Hepatobiliary: No focal liver abnormality is seen. No gallstones, gallbladder wall thickening, or biliary dilatation.   Pancreas: No pancreatic ductal dilatation or surrounding inflammatory changes.   Spleen: No pancreatic ductal dilatation or surrounding inflammatory changes.   Adrenals/Urinary Tract: Adrenal glands are unremarkable. Multiple small RIGHT renal cortical hypodensities, largest a conglomerate at the midpole measuring 2.0 cm however incompletely characterized. Kidneys are otherwise normal, without renal calculi, focal lesion, or hydronephrosis. Bladder is unremarkable.   Stomach/Bowel: Stomach is within normal limits. Appendix is not definitively visualized. No evidence of bowel wall thickening, distention, or inflammatory changes. Liquid stool within colon.   Lymphatic: No enlarged abdominal or pelvic lymph nodes.   Reproductive: Prostate is unremarkable.   Other: No abdominal wall hernia or abnormality. No abdominopelvic ascites.   Musculoskeletal: Multilevel degenerative changes of the spine. No acute or significant osseous findings.   IMPRESSION: VASCULAR   1. Patent mesenteric arteries. No CTA evidence of vascular compromise to bowel. 2. Aortic Atherosclerosis (ICD10-I70.0) and severe burden of coronary atherosclerosis.   NON-VASCULAR   1. No acute abdominopelvic process. 2. Liquid stool within the colon, correlate for diarrhea. 3. 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 No follow-up imaging is recommended. JACR 2018 Feb; 264-273, Management of the Incidental Renal Mass on CT, [employer] 2021; 814-848, Bosniak Classification of Cystic Renal Masses, Version 2019.     Electronically Signed   By: Michaelle Birks M.D.   On: 09/05/2022 07:40   Labs:  Recent Labs    09/05/22 0145  WBC 3.8*  HGB 15.5  HCT 46.3  PLT 253   Recent Labs    09/05/22 0145  NA 138  K 3.9  CL 106  CO2 26   GLUCOSE 111*  BUN 13  CREATININE 1.07  CALCIUM 8.8*   Recent Labs    09/05/22 0145  PROT 7.4  ALBUMIN 3.8  AST 23  ALT 26  ALKPHOS 64  BILITOT 1.2   No results for input(s): "HEPBSAG", "HCVAB", "HEPAIGM", "HEPBIGM" in the last 72 hours. No results for input(s): "LABPROT", "INR" in the last 72 hours.  Past Medical History:  Diagnosis Date   Deaf, bilateral    HTN (hypertension)     Past Surgical History:  Procedure Laterality Date   ABDOMINAL SURGERY     LEFT HEART CATH AND CORONARY ANGIOGRAPHY N/A 02/23/2018   Procedure: LEFT HEART CATH AND CORONARY ANGIOGRAPHY;  Surgeon: Burnell Blanks, MD;  Location: [facility];  Service: Cardiovascular;  Laterality: N/A;    History reviewed. No pertinent family history.  Prior to Admission medications   Medication Sig Start Date End Date Taking? Authorizing Provider  losartan (COZAAR) 25 MG tablet Take 1 tablet (25 mg total) by mouth daily. 08/09/22   McElwee, Scheryl Darter, NP    Current Facility-Administered Medications  Medication Dose Route Frequency Provider Last Rate Last Admin   0.9 % NaCl with KCl 20 mEq/ L  infusion   Intravenous Continuous Quintella Baton, MD 75 mL/hr at 09/05/22 0614 New Bag at 09/05/22 [id number]   acetaminophen (TYLENOL) tablet 650 mg  650 mg Oral Q6H PRN Quintella Baton, MD  Or   acetaminophen (TYLENOL) suppository 650 mg  650 mg Rectal Q6H PRN Crosley, Debby, MD       dicyclomine (BENTYL) capsule 10 mg  10 mg Oral Q8H PRN Crosley, Debby, MD       losartan (COZAAR) tablet 25 mg  25 mg Oral Daily Rizwan, Eunice Blase, MD       ondansetron (ZOFRAN) tablet 4 mg  4 mg Oral Q6H PRN Crosley, Debby, MD       Or   ondansetron (ZOFRAN) injection 4 mg  4 mg Intravenous Q6H PRN Quintella Baton, MD        Allergies as of 09/05/2022   (No Known Allergies)    Social History   Socioeconomic History   Marital status: Single    Spouse name: Not on file   Number of children: Not on file   Years of  education: Not on file   Highest education level: Not on file  Occupational History   Not on file  Tobacco Use   Smoking status: Never   Smokeless tobacco: Never  Vaping Use   Vaping Use: Never used  Substance and Sexual Activity   Alcohol use: Yes    Comment: socially   Drug use: Never   Sexual activity: Not on file  Other Topics Concern   Not on file  Social History Narrative   ** Merged History Encounter **       Social Determinants of Health   Financial Resource Strain: Not on file  Food Insecurity: No Food Insecurity (09/05/2022)   Hunger Vital Sign    Worried About Running Out of Food in the Last Year: Never true    Ran Out of Food in the Last Year: Never true  Transportation Needs: No Transportation Needs (09/05/2022)   PRAPARE - Hydrologist (Medical): No    Lack of Transportation (Non-Medical): No  Physical Activity: Not on file  Stress: Not on file  Social Connections: Not on file  Intimate Partner Violence: Not At Risk (09/05/2022)   Humiliation, Afraid, Rape, and Kick questionnaire    Fear of Current or Ex-Partner: No    Emotionally Abused: No    Physically Abused: No    Sexually Abused: No    Review of Systems: All systems reviewed and negative except where noted in HPI.  Physical Exam: Vital signs in last 24 hours: Temp:  [97.4 F (36.3 C)-97.9 F (36.6 C)] 97.4 F (36.3 C) (03/10 0750) Pulse Rate:  [65-92] 67 (03/10 0750) Resp:  [16] 16 (03/10 0750) BP: (133-154)/(80-109) 133/80 (03/10 0750) SpO2:  [97 %-100 %] 100 % (03/10 0750) Weight:  [71.5 kg] 71.5 kg (03/10 [id number])    General:  Alert male in NAD Psych:  Pleasant, cooperative. Normal mood and affect Eyes: Pupils equal Ears:  Deaf Nose: No deformity, discharge or lesions Neck:  Supple, no masses felt Lungs:  Clear to auscultation.  Heart:  Regular rate, regular rhythm.  Abdomen:  Soft, nondistended, nontender, active bowel sounds, no masses felt Rectal :   Scant amount of very light brown liquid, malodorous stool in vault.  Msk: Symmetrical without gross deformities.  Neurologic:  Alert, oriented, grossly normal neurologically Extremities : No edema Skin:  Intact without significant lesions.    Intake/Output from previous day: No intake/output data recorded. Intake/Output this shift:  No intake/output data recorded.    Principal Problem:   GI bleed Active Problems:   Accelerated hypertension   Hyperlipidemia    Tye Savoy,  NP-C @  09/05/2022, 8:51 AM

## 2022-09-05 NOTE — ED Notes (Signed)
Call Claiborne Billings at [phone] for sign language resources.

## 2022-09-06 DIAGNOSIS — A0831 Calicivirus enteritis: Secondary | ICD-10-CM

## 2022-09-06 DIAGNOSIS — A09 Infectious gastroenteritis and colitis, unspecified: Secondary | ICD-10-CM | POA: Diagnosis not present

## 2022-09-06 DIAGNOSIS — H9190 Unspecified hearing loss, unspecified ear: Secondary | ICD-10-CM | POA: Diagnosis present

## 2022-09-06 DIAGNOSIS — A0839 Other viral enteritis: Secondary | ICD-10-CM | POA: Diagnosis present

## 2022-09-06 DIAGNOSIS — A0811 Acute gastroenteropathy due to Norwalk agent: Secondary | ICD-10-CM | POA: Diagnosis not present

## 2022-09-06 DIAGNOSIS — E785 Hyperlipidemia, unspecified: Secondary | ICD-10-CM | POA: Diagnosis present

## 2022-09-06 DIAGNOSIS — A084 Viral intestinal infection, unspecified: Secondary | ICD-10-CM | POA: Diagnosis present

## 2022-09-06 DIAGNOSIS — K921 Melena: Secondary | ICD-10-CM | POA: Diagnosis present

## 2022-09-06 DIAGNOSIS — K625 Hemorrhage of anus and rectum: Secondary | ICD-10-CM | POA: Diagnosis not present

## 2022-09-06 DIAGNOSIS — I10 Essential (primary) hypertension: Secondary | ICD-10-CM | POA: Diagnosis present

## 2022-09-06 DIAGNOSIS — Z79899 Other long term (current) drug therapy: Secondary | ICD-10-CM | POA: Diagnosis not present

## 2022-09-06 DIAGNOSIS — K219 Gastro-esophageal reflux disease without esophagitis: Secondary | ICD-10-CM | POA: Diagnosis present

## 2022-09-06 DIAGNOSIS — Z603 Acculturation difficulty: Secondary | ICD-10-CM | POA: Diagnosis present

## 2022-09-06 DIAGNOSIS — A059 Bacterial foodborne intoxication, unspecified: Secondary | ICD-10-CM | POA: Diagnosis present

## 2022-09-06 LAB — GASTROINTESTINAL PANEL BY PCR, STOOL (REPLACES STOOL CULTURE)

## 2022-09-06 LAB — HEMOGLOBIN AND HEMATOCRIT, BLOOD
HCT: 41.5 % (ref 39.0–52.0)
HCT: 43.2 % (ref 39.0–52.0)
Hemoglobin: 14.2 g/dL (ref 13.0–17.0)
Hemoglobin: 15 g/dL (ref 13.0–17.0)

## 2022-09-06 LAB — BASIC METABOLIC PANEL
Anion gap: 7 (ref 5–15)
BUN: 9 mg/dL (ref 8–23)
CO2: 21 mmol/L — ABNORMAL LOW (ref 22–32)
Calcium: 8.8 mg/dL — ABNORMAL LOW (ref 8.9–10.3)
Chloride: 110 mmol/L (ref 98–111)
Creatinine, Ser: 0.93 mg/dL (ref 0.61–1.24)
GFR, Estimated: >60 mL/min (ref >60)
Glucose, Bld: 86 mg/dL (ref 70–99)
Potassium: 3.9 mmol/L (ref 3.5–5.1)
Sodium: 138 mmol/L (ref 135–145)

## 2022-09-06 MED ORDER — LOPERAMIDE HCL 2 MG PO CAPS
4.0000 mg | ORAL_CAPSULE | Freq: Once | ORAL | Status: AC
  Administered 2022-09-06: 4 mg via ORAL
  Filled 2022-09-06: qty 2

## 2022-09-06 MED ORDER — LOPERAMIDE HCL 2 MG PO CAPS
2.0000 mg | ORAL_CAPSULE | ORAL | Status: DC | PRN
  Administered 2022-09-06: 2 mg via ORAL
  Filled 2022-09-06: qty 1

## 2022-09-06 MED ORDER — LOPERAMIDE HCL 2 MG PO CAPS: 2.0000 mg | ORAL_CAPSULE | ORAL | Status: DC | PRN

## 2022-09-06 NOTE — Progress Notes (Addendum)
PROGRESS NOTE    Bobby Griffith.  JN:[id number] DOB: [birth year] DOA: 09/05/2022 PCP: Charyl Dancer, NP    Brief Narrative:   Bobby Gershon. is a 72 y.o. male with past medical history significant for essential hypertension, deafness who presented to [facility] ED on 3/10 with abdominal pain, diarrhea associated with blood in the stool.  Patient reports that he recently ate fish at a cookout and symptoms with short onset thereafter.  He reports other people with similar complaints following ingestion of the food.  Patient denies history of rectal bleeding, no hemorrhoids or NSAID abuse.  He reports colonoscopy in the past.  In the ED, amateur 97.9 F, HR 91, RR 16, BP 140/80, SpO2 97% room air.  WBC 3.8, hemoglobin 15.5, platelets 253.  Sodium 138, potassium 3.9, chloride 106, CO2 26, glucose 111, BUN 13, creatinine 1.07.  AST 23, ALT 26, total bilirubin 1.2.  Urinalysis unrevealing.  FOBT positive.  CT angiogram GI bleed study with no acute abdominal pelvic process, liquid stool within the colon, renal cyst measuring 2 cm with no follow-up imaging recommended.  GI was consulted.  TRH consulted for further evaluation and management of diarrhea associated with bloody stool.  Assessment & Plan:   Acute viral gastroenteritis  Sapovirus Patient presenting to ED with crampy abdominal pain associated with bloody diarrhea following ingestion of likely contaminated food.  Hemoglobin 15.5 on admission, FOBT positive.  GI PCR panel positive for Sapovirus.  C. difficile PCR negative.  CT angiogram GI bleeding study negative.  Currently GI has no plans for endoscopic evaluation. -- [facility] following, appreciate assistance -- Hgb 15.5>14.8>15.2>14.2>15.0 -- Imodium as needed -- Bentyl 10 mg p.o. every 8 hours -- Enteric precautions -- Continue clear liquid diet, further advancement per GI -- Continue IV fluids -- CBC in AM.  Essential hypertension On losartan 25 mg p.o. daily at home. -- BP  100/72 this morning, continue to hold home losartan -- Follow BP closely  GERD -- Protonix 40 mg p.o. twice daily     DVT prophylaxis: SCDs Start: 09/05/22 [id number]    Code Status: Full Code Family Communication:   Disposition Plan:  Level of care: Med-Surg Status is: Observation The patient will require care spanning > 2 midnights and should be moved to inpatient because: Continues on IV fluids, awaiting further GI recommendations    Consultants:  [facility]  Procedures:  None  Antimicrobials:  None   Subjective: Patient seen examined bedside, resting comfortably.  Lying in bed.  Assisted with sign language interpretation with video interpreter Robyn # [phone].  Patient continues with intermittent diarrhea associated with blood in the stool.  Hemoglobin up to 15.0 this morning.  Seen by GI, no plans for endoscopy currently given stability of hemoglobin.  GI PCR panel positive for Sapa virus.  Etiology of his symptoms likely related to food poisoning event.  Patient with no other questions or concerns at this time.  Denies headache, no dizziness, no chest pain, no palpitations, no shortness of breath, no fever/chills/night sweats, no nausea/vomiting, no fatigue, no focal weakness, no paresthesias.  No acute events overnight per nursing staff.  Objective: Vitals:   09/05/22 1723 09/06/22 0453 09/06/22 0825 09/06/22 1559  BP: 131/74 100/72 111/75 136/62  Pulse: 69 79 (!) 57 62  Resp: '17 18 18 18  '$ Temp: 97.8 F (36.6 C) 98 F (36.7 C) 98.1 F (36.7 C) (!) 97.4 F (36.3 C)  TempSrc:  Oral Oral Oral  SpO2:  99% 97% 98% 98%  Weight:      Height:        Intake/Output Summary (Last 24 hours) at 09/06/2022 1730 Last data filed at 09/06/2022 1600 Gross per 24 hour  Intake 1821.11 ml  Output 1000 ml  Net 821.11 ml   Filed Weights   09/05/22 0814  Weight: 71.5 kg    Examination:  Physical Exam: GEN: NAD, alert and oriented x 3, deaf HEENT: NCAT, PERRL,  EOMI, sclera clear, MMM PULM: CTAB w/o wheezes/crackles, normal respiratory effort, on room air CV: RRR w/o M/G/R GI: abd soft, NTND, NABS, no R/G/M MSK: no peripheral edema, muscle strength globally intact 5/5 bilateral upper/lower extremities NEURO: CN II-XII intact, no focal deficits, sensation to light touch intact PSYCH: normal mood/affect Integumentary: dry/intact, no rashes or wounds    Data Reviewed: I have personally reviewed following labs and imaging studies  CBC: Recent Labs  Lab 09/05/22 0145 09/05/22 1005 09/05/22 1803 09/06/22 0201 09/06/22 0856  WBC 3.8*  --   --   --   --   HGB 15.5 14.8 15.2 14.2 15.0  HCT 46.3 44.4 45.4 41.5 43.2  MCV 94.3  --   --   --   --   PLT 253  --   --   --   --    Basic Metabolic Panel: Recent Labs  Lab 09/05/22 0145 09/05/22 1803 09/06/22 0201  NA 138 136 138  K 3.9 4.0 3.9  CL 106 107 110  CO2 26 20* 21*  GLUCOSE 111* 80 86  BUN '13 11 9  '$ CREATININE 1.07 0.94 0.93  CALCIUM 8.8* 8.8* 8.8*   GFR: Estimated Creatinine Clearance: 72.9 mL/min (by C-G formula based on SCr of 0.93 mg/dL). Liver Function Tests: Recent Labs  Lab 09/05/22 0145  AST 23  ALT 26  ALKPHOS 64  BILITOT 1.2  PROT 7.4  ALBUMIN 3.8   Recent Labs  Lab 09/05/22 0145  LIPASE 39   No results for input(s): "AMMONIA" in the last 168 hours. Coagulation Profile: No results for input(s): "INR", "PROTIME" in the last 168 hours. Cardiac Enzymes: No results for input(s): "CKTOTAL", "CKMB", "CKMBINDEX", "TROPONINI" in the last 168 hours. BNP (last 3 results) No results for input(s): "PROBNP" in the last 8760 hours. HbA1C: No results for input(s): "HGBA1C" in the last 72 hours. CBG: No results for input(s): "GLUCAP" in the last 168 hours. Lipid Profile: No results for input(s): "CHOL", "HDL", "LDLCALC", "TRIG", "CHOLHDL", "LDLDIRECT" in the last 72 hours. Thyroid Function Tests: No results for input(s): "TSH", "T4TOTAL", "FREET4", "T3FREE",  "THYROIDAB" in the last 72 hours. Anemia Panel: No results for input(s): "VITAMINB12", "FOLATE", "FERRITIN", "TIBC", "IRON", "RETICCTPCT" in the last 72 hours. Sepsis Labs: No results for input(s): "PROCALCITON", "LATICACIDVEN" in the last 168 hours.  Recent Results (from the past 240 hour(s))  C Difficile Quick Screen w PCR reflex     Status: None   Collection Time: 09/05/22  4:12 PM   Specimen: Stool  Result Value Ref Range Status   C Diff antigen NEGATIVE NEGATIVE Final   C Diff toxin NEGATIVE NEGATIVE Final   C Diff interpretation No C. difficile detected.  Final    Comment: Performed at [facility], [place] [address], [place], [place] [address]  Gastrointestinal Panel by PCR , Stool     Status: Abnormal   Collection Time: 09/05/22  4:12 PM   Specimen: Stool  Result Value Ref Range Status   Campylobacter species NOT DETECTED NOT  DETECTED Final   Plesimonas shigelloides NOT DETECTED NOT DETECTED Final   Salmonella species NOT DETECTED NOT DETECTED Final   Yersinia enterocolitica NOT DETECTED NOT DETECTED Final   Vibrio species NOT DETECTED NOT DETECTED Final   Vibrio cholerae NOT DETECTED NOT DETECTED Final   Enteroaggregative E coli (EAEC) NOT DETECTED NOT DETECTED Final   Enteropathogenic E coli (EPEC) NOT DETECTED NOT DETECTED Final   Enterotoxigenic E coli (ETEC) NOT DETECTED NOT DETECTED Final   Shiga like toxin producing E coli (STEC) NOT DETECTED NOT DETECTED Final   Shigella/Enteroinvasive E coli (EIEC) NOT DETECTED NOT DETECTED Final   Cryptosporidium NOT DETECTED NOT DETECTED Final   Cyclospora cayetanensis NOT DETECTED NOT DETECTED Final   Entamoeba histolytica NOT DETECTED NOT DETECTED Final   Giardia lamblia NOT DETECTED NOT DETECTED Final   Adenovirus F40/41 NOT DETECTED NOT DETECTED Final   Astrovirus NOT DETECTED NOT DETECTED Final   Norovirus GI/GII NOT DETECTED NOT DETECTED Final   Rotavirus A NOT DETECTED NOT DETECTED Final   Sapovirus (I, II, IV, and  V) DETECTED (A) NOT DETECTED Final    Comment: Performed at [facility], [address], [place], [place] [address]         Radiology Studies: CT ANGIO GI BLEED  Result Date: 09/05/2022 CLINICAL DATA:  Mesenteric ischemia, acute EXAM: CTA ABDOMEN AND PELVIS WITHOUT AND WITH CONTRAST TECHNIQUE: Multidetector CT imaging of the abdomen and pelvis was performed using the standard protocol during bolus administration of intravenous contrast. Multiplanar reconstructed images and MIPs were obtained and reviewed to evaluate the vascular anatomy. RADIATION DOSE REDUCTION: This exam was performed according to the departmental dose-optimization program which includes automated exposure control, adjustment of the mA and/or kV according to patient size and/or use of iterative reconstruction technique. CONTRAST:  63m OMNIPAQUE IOHEXOL 350 MG/ML SOLN COMPARISON:  CTA PE, 04/11/2022.  Chest XR, 06/11/2022. FINDINGS: Suboptimal evaluation, secondary to motion degradation. VASCULAR Aorta: Mild-to-moderate burden of aortoiliac atherosclerosis. Normal caliber aorta without aneurysm, dissection, vasculitis or significant stenosis. Celiac: Patent without evidence of aneurysm, dissection, vasculitis or significant stenosis. SMA: Patent without evidence of aneurysm, dissection, vasculitis or significant stenosis. Renals: Single renal arteries are present. Both renal arteries are patent without evidence of aneurysm, dissection, vasculitis, fibromuscular dysplasia or significant stenosis. IMA: Patent without evidence of aneurysm, dissection, vasculitis or significant stenosis. Pelvis: Patent without evidence of aneurysm, dissection, vasculitis or significant stenosis. Proximal Outflow: Bilateral common femoral and visualized portions of the superficial and profunda femoral arteries are patent without evidence of aneurysm, dissection, vasculitis or significant stenosis. Veins: No obvious venous abnormality within the  limitations of this arterial phase study. Review of the MIP images confirms the above findings. NON-VASCULAR Lower chest: No acute abnormality. Severe burden multivessel coronary atherosclerosis. Hepatobiliary: No focal liver abnormality is seen. No gallstones, gallbladder wall thickening, or biliary dilatation. Pancreas: No pancreatic ductal dilatation or surrounding inflammatory changes. Spleen: No pancreatic ductal dilatation or surrounding inflammatory changes. Adrenals/Urinary Tract: Adrenal glands are unremarkable. Multiple small RIGHT renal cortical hypodensities, largest a conglomerate at the midpole measuring 2.0 cm however incompletely characterized. Kidneys are otherwise normal, without renal calculi, focal lesion, or hydronephrosis. Bladder is unremarkable. Stomach/Bowel: Stomach is within normal limits. Appendix is not definitively visualized. No evidence of bowel wall thickening, distention, or inflammatory changes. Liquid stool within colon. Lymphatic: No enlarged abdominal or pelvic lymph nodes. Reproductive: Prostate is unremarkable. Other: No abdominal wall hernia or abnormality. No abdominopelvic ascites. Musculoskeletal: Multilevel degenerative changes of the spine. No  acute or significant osseous findings. IMPRESSION: VASCULAR 1. Patent mesenteric arteries. No CTA evidence of vascular compromise to bowel. 2. Aortic Atherosclerosis (ICD10-I70.0) and severe burden of coronary atherosclerosis. NON-VASCULAR 1. No acute abdominopelvic process. 2. Liquid stool within the colon, correlate for diarrhea. 3. 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 No follow-up imaging is recommended. JACR 2018 Feb; 264-273, Management of the Incidental Renal Mass on CT, [employer] 2021; 814-848, Bosniak Classification of Cystic Renal Masses, Version 2019. Electronically Signed   By: Michaelle Birks M.D.   On: 09/05/2022 07:40        Scheduled Meds:  dicyclomine  10 mg Oral Q8H   losartan  25 mg Oral Daily    pantoprazole  40 mg Oral BID   Continuous Infusions:  0.9 % NaCl with KCl 20 mEq / L 75 mL/hr at 09/06/22 1019     LOS: 0 days    Time spent: 49 minutes spent on chart review, discussion with nursing staff, consultants, updating family and interview/physical exam; more than 50% of that time was spent in counseling and/or coordination of care.    Taletha Twiford J [place] DO [facility] Available via Epic secure chat 7am-7pm After these hours, please refer to coverage provider listed on [email or web address] 09/06/2022, 5:30 PM

## 2022-09-06 NOTE — TOC Initial Note (Signed)
.  Transition of Care (TOC) - Initial/Assessment Note    Patient Details  Name: Bobby Griffith. MRN: [id number] Date of Birth: May 20, 1951  Transition of Care [facility]) CM/SW Contact:    Ninfa Meeker, RN Phone Number: 09/06/2022, 1:29 PM  Clinical Narrative:                  Transition of Care [facility]) Department has reviewed patient and no TOC needs have been identified at this time. We will continue to monitor patient advancement through Interdisciplinary progressions and if new patient needs arise, please place a consult        Patient Goals and CMS Choice            Expected Discharge Plan and Services                                              Prior Living Arrangements/Services                       Activities of Daily Living Home Assistive Devices/Equipment: None ADL Screening (condition at time of admission) Patient's cognitive ability adequate to safely complete daily activities?: Yes Is the patient deaf or have difficulty hearing?: Yes Does the patient have difficulty seeing, even when wearing glasses/contacts?: No Does the patient have difficulty concentrating, remembering, or making decisions?: No Patient able to express need for assistance with ADLs?: No Does the patient have difficulty dressing or bathing?: No Independently performs ADLs?: Yes (appropriate for developmental age) Does the patient have difficulty walking or climbing stairs?: No Weakness of Legs: None Weakness of Arms/Hands: None  Permission Sought/Granted                  Emotional Assessment              Admission diagnosis:  Hematochezia [K92.1] GI bleed [K92.2] Lower GI bleed [K92.2] Patient Active Problem List   Diagnosis Date Noted   GI bleed 09/05/2022   Hyperlipidemia 09/05/2022   Hematochezia 09/05/2022   Diarrhea 09/05/2022   Pure hypercholesterolemia 08/04/2022   Gastroesophageal reflux disease 08/04/2022   Erectile dysfunction  10/21/2021   PCP:  Charyl Dancer, NP Pharmacy:   [id number] [address], [place] [address] Phone: [phone] Fax: [phone]  [address], [address] [place] [address] Phone: [phone] Fax: [phone]     Social Determinants of Health (SDOH) Social History: [place]: No Food Insecurity (09/05/2022)  Housing: Low Risk  (09/05/2022)  Transportation Needs: No Transportation Needs (09/05/2022)  Utilities: Not At Risk (09/05/2022)  Depression (PHQ2-9): Low Risk  (08/04/2022)  Tobacco Use: Low Risk  (09/05/2022)   SDOH Interventions:     Readmission Risk Interventions     No data to display

## 2022-09-06 NOTE — Plan of Care (Signed)

## 2022-09-06 NOTE — Progress Notes (Signed)
   09/06/22 1020  Spiritual Encounters  Type of Visit Initial  Care provided to: Patient  Conversation partners present during encounter Other (comment) (sign language inerpreters)  Referral source Nurse (RN/NT/LPN)  Reason for visit Advance directives  OnCall Visit No  Advance Directives (For Healthcare)  Does Patient Have a Medical Advance Directive? No  Would patient like information on creating a medical advance directive? Yes (Inpatient - patient requests chaplain consult to create a medical advance directive)   Chaplain provided patient with information on HCPOA. Patient asked that his brother be on the phone or present while chaplain discusses AD. Patient tried to reach out to brother but was unable to reach him. Chaplain will follow up per patient request when his brother can be reached.   Note prepared by Abbott Pao, [place] Resident [phone]

## 2022-09-06 NOTE — Progress Notes (Signed)
Progress Note   Subjective  Chief Complaint:Diarrhea and hematochezia  Hemoglobin very stable and actually increased a little bit overnight from 14.2--> 15  Interpreter is used at time of visit.  Patient continues to describe 4 loose stools even just this morning which all had some blood in them per him.  Denies any new complaints or concerns.   Objective   Vital signs in last 24 hours: Temp:  [97.8 F (36.6 C)-98.1 F (36.7 C)] 98.1 F (36.7 C) (03/11 0825) Pulse Rate:  [57-79] 57 (03/11 0825) Resp:  [17-18] 18 (03/11 0825) BP: (100-131)/(72-75) 111/75 (03/11 0825) SpO2:  [97 %-99 %] 98 % (03/11 0825) Last BM Date : 09/05/22 General:    AA male in NAD Heart:  Regular rate and rhythm; no murmurs Lungs: Respirations even and unlabored, lungs CTA bilaterally Abdomen:  Soft, nontender and nondistended. Normal bowel sounds. Psych:  Cooperative. Normal mood and affect.  Intake/Output from previous day: 03/10 0701 - 03/11 0700 In: 1558.7 [I.V.:1558.7] Out: 0  Intake/Output this shift: Total I/O In: 509.8 [I.V.:509.8] Out: -   Lab Results: Recent Labs    09/05/22 0145 09/05/22 1005 09/05/22 1803 09/06/22 0201 09/06/22 0856  WBC 3.8*  --   --   --   --   HGB 15.5   < > 15.2 14.2 15.0  HCT 46.3   < > 45.4 41.5 43.2  PLT 253  --   --   --   --    < > = values in this interval not displayed.   BMET Recent Labs    09/05/22 0145 09/05/22 1803 09/06/22 0201  NA 138 136 138  K 3.9 4.0 3.9  CL 106 107 110  CO2 26 20* 21*  GLUCOSE 111* 80 86  BUN '13 11 9  '$ CREATININE 1.07 0.94 0.93  CALCIUM 8.8* 8.8* 8.8*   LFT Recent Labs    09/05/22 0145  PROT 7.4  ALBUMIN 3.8  AST 23  ALT 26  ALKPHOS 64  BILITOT 1.2   Studies/Results: CT ANGIO GI BLEED  Result Date: 09/05/2022 CLINICAL DATA:  Mesenteric ischemia, acute EXAM: CTA ABDOMEN AND PELVIS WITHOUT AND WITH CONTRAST TECHNIQUE: Multidetector CT imaging of the abdomen and pelvis was performed using the  standard protocol during bolus administration of intravenous contrast. Multiplanar reconstructed images and MIPs were obtained and reviewed to evaluate the vascular anatomy. RADIATION DOSE REDUCTION: This exam was performed according to the departmental dose-optimization program which includes automated exposure control, adjustment of the mA and/or kV according to patient size and/or use of iterative reconstruction technique. CONTRAST:  75m OMNIPAQUE IOHEXOL 350 MG/ML SOLN COMPARISON:  CTA PE, 04/11/2022.  Chest XR, 06/11/2022. FINDINGS: Suboptimal evaluation, secondary to motion degradation. VASCULAR Aorta: Mild-to-moderate burden of aortoiliac atherosclerosis. Normal caliber aorta without aneurysm, dissection, vasculitis or significant stenosis. Celiac: Patent without evidence of aneurysm, dissection, vasculitis or significant stenosis. SMA: Patent without evidence of aneurysm, dissection, vasculitis or significant stenosis. Renals: Single renal arteries are present. Both renal arteries are patent without evidence of aneurysm, dissection, vasculitis, fibromuscular dysplasia or significant stenosis. IMA: Patent without evidence of aneurysm, dissection, vasculitis or significant stenosis. Pelvis: Patent without evidence of aneurysm, dissection, vasculitis or significant stenosis. Proximal Outflow: Bilateral common femoral and visualized portions of the superficial and profunda femoral arteries are patent without evidence of aneurysm, dissection, vasculitis or significant stenosis. Veins: No obvious venous abnormality within the limitations of this arterial phase study. Review of the MIP images confirms the above findings. NON-VASCULAR  Lower chest: No acute abnormality. Severe burden multivessel coronary atherosclerosis. Hepatobiliary: No focal liver abnormality is seen. No gallstones, gallbladder wall thickening, or biliary dilatation. Pancreas: No pancreatic ductal dilatation or surrounding inflammatory changes.  Spleen: No pancreatic ductal dilatation or surrounding inflammatory changes. Adrenals/Urinary Tract: Adrenal glands are unremarkable. Multiple small RIGHT renal cortical hypodensities, largest a conglomerate at the midpole measuring 2.0 cm however incompletely characterized. Kidneys are otherwise normal, without renal calculi, focal lesion, or hydronephrosis. Bladder is unremarkable. Stomach/Bowel: Stomach is within normal limits. Appendix is not definitively visualized. No evidence of bowel wall thickening, distention, or inflammatory changes. Liquid stool within colon. Lymphatic: No enlarged abdominal or pelvic lymph nodes. Reproductive: Prostate is unremarkable. Other: No abdominal wall hernia or abnormality. No abdominopelvic ascites. Musculoskeletal: Multilevel degenerative changes of the spine. No acute or significant osseous findings. IMPRESSION: VASCULAR 1. Patent mesenteric arteries. No CTA evidence of vascular compromise to bowel. 2. Aortic Atherosclerosis (ICD10-I70.0) and severe burden of coronary atherosclerosis. NON-VASCULAR 1. No acute abdominopelvic process. 2. Liquid stool within the colon, correlate for diarrhea. 3. 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 No follow-up imaging is recommended. JACR 2018 Feb; 264-273, Management of the Incidental Renal Mass on CT, [employer] 2021; 814-848, Bosniak Classification of Cystic Renal Masses, Version 2019. Electronically Signed   By: Michaelle Birks M.D.   On: 09/05/2022 07:40       Assessment / Plan:   Assessment: 1.  Abdominal pain/diarrhea with blood: Patient has continued with loose stools this morning, 4 as of around 10:30 AM, all with some blood, though hemoglobin is very stable and in fact increased a little bit overnight to 15, stool studies came back positive for the Sapovirus which could very well explain symptoms, given infectious cause identified will not plan for colonoscopy during this hospitalization unless hemoglobin drops 2.   Deaf  Plan: 1.  Continue supportive measures for viral gastroenteritis 2.  No plans for emergent colonoscopy unless there is a large change in hemoglobin 3.  Continue to monitor CBC while inpatient 4. Could consider Imodium to help with frequency  Thank you for your kind consultation, we will likely sign off.   LOS: 0 days   Levin Erp  09/06/2022, 11:46 AM

## 2022-09-07 ENCOUNTER — Emergency Department (HOSPITAL_COMMUNITY): Admission: EM | Admit: 2022-09-07 | Payer: 59 | Source: Home / Self Care

## 2022-09-07 DIAGNOSIS — A0811 Acute gastroenteropathy due to Norwalk agent: Secondary | ICD-10-CM

## 2022-09-07 LAB — CBC
HCT: 41.8 % (ref 39.0–52.0)
Hemoglobin: 14.4 g/dL (ref 13.0–17.0)
MCH: 31.8 pg (ref 26.0–34.0)
MCHC: 34.4 g/dL (ref 30.0–36.0)
MCV: 92.3 fL (ref 80.0–100.0)
Platelets: 210 10*3/uL (ref 150–400)
RBC: 4.53 MIL/uL (ref 4.22–5.81)
RDW: 13.4 % (ref 11.5–15.5)
WBC: 3.3 10*3/uL — ABNORMAL LOW (ref 4.0–10.5)
nRBC: 0 % (ref 0.0–0.2)

## 2022-09-07 MED ORDER — LOPERAMIDE HCL 2 MG PO CAPS: 2.0000 mg | ORAL_CAPSULE | ORAL | 0 refills | Status: AC | PRN

## 2022-09-07 MED ORDER — PANTOPRAZOLE SODIUM 40 MG PO TBEC: 40.0000 mg | DELAYED_RELEASE_TABLET | Freq: Every day | ORAL | 2 refills | Status: DC

## 2022-09-07 NOTE — Discharge Summary (Signed)
Physician Discharge Summary  Bobby Griffith. [id number] DOB: March 06, 1951 DOA: 09/05/2022  PCP: Charyl Dancer, NP  Admit date: 09/05/2022 Discharge date: 09/07/2022  Admitted From: Home Disposition: Home  Recommendations for Outpatient Follow-up:  Follow up with PCP in 1-2 weeks Ambulatory referral sent to gastroenterology for screening colonoscopy outpatient Continue Imodium as needed for diarrhea secondary to viral gastroenteritis Please obtain CBC in one week  Home Health: No Equipment/Devices: None  Discharge Condition: Stable CODE STATUS: Full code Diet recommendation: Heart healthy diet  History of present illness:  Bobby Griffith. is a 72 y.o. male with past medical history significant for essential hypertension, deafness who presented to [facility] ED on 3/10 with abdominal pain, diarrhea associated with blood in the stool.  Patient reports that he recently ate fish at a cookout and symptoms with short onset thereafter.  He reports other people with similar complaints following ingestion of the food.  Patient denies history of rectal bleeding, no hemorrhoids or NSAID abuse.  He reports colonoscopy in the past.   In the ED, amateur 97.9 F, HR 91, RR 16, BP 140/80, SpO2 97% room air. WBC 3.8, hemoglobin 15.5, platelets 253.  Sodium 138, potassium 3.9, chloride 106, CO2 26, glucose 111, BUN 13, creatinine 1.07.  AST 23, ALT 26, total bilirubin 1.2.  Urinalysis unrevealing.  FOBT positive.  CT angiogram GI bleed study with no acute abdominal pelvic process, liquid stool within the colon, renal cyst measuring 2 cm with no follow-up imaging recommended.  GI was consulted.  TRH consulted for further evaluation and management of diarrhea associated with bloody stool.  Hospital course:  Acute viral gastroenteritis  Sapovirus Patient presenting to ED with crampy abdominal pain associated with bloody diarrhea following ingestion of likely contaminated food.  Hemoglobin 15.5 on  admission, FOBT positive.  GI PCR panel positive for Sapovirus.  C. difficile PCR negative.  CT angiogram GI bleeding study negative.  Currently GI has no plans for endoscopic evaluation.  Patient's diarrhea slowly improved during hospitalization with decreased amount of blood noted in his stool.  Hemoglobin remained stable and was 15.0 at time of discharge.  Continue Imodium as needed.  Ambulatory referral placed for outpatient GI evaluation for screening colonoscopy.   Essential hypertension On losartan 25 mg p.o. daily at home.  GERD: Protonix 40 mg p.o. daily   Discharge Diagnoses:  Principal Problem:   GI bleed Active Problems:   Hyperlipidemia   Hematochezia   Diarrhea   Viral gastroenteritis    Discharge Instructions  Discharge Instructions     Ambulatory referral to Gastroenterology   Complete by: As directed    What is the reason for referral?: Colonoscopy   Call MD for:  difficulty breathing, headache or visual disturbances   Complete by: As directed    Call MD for:  extreme fatigue   Complete by: As directed    Call MD for:  persistant dizziness or light-headedness   Complete by: As directed    Call MD for:  persistant nausea and vomiting   Complete by: As directed    Call MD for:  severe uncontrolled pain   Complete by: As directed    Call MD for:  temperature >100.4   Complete by: As directed    Diet - low sodium heart healthy   Complete by: As directed    Increase activity slowly   Complete by: As directed       Allergies as of 09/07/2022   No Known  Allergies      Medication List     TAKE these medications    loperamide 2 MG capsule Commonly known as: IMODIUM Take 1 capsule (2 mg total) by mouth as needed for diarrhea or loose stools.   losartan 25 MG tablet Commonly known as: COZAAR Take 1 tablet (25 mg total) by mouth daily.        Follow-up Information     Charyl Dancer, NP. Schedule an appointment as soon as possible for a  visit in 1 week(s).   Specialty: Internal Medicine Contact information: [place] [id number] [phone]                No Known Allergies  Consultations: [facility]   Procedures/Studies: CT ANGIO GI BLEED  Result Date: 09/05/2022 CLINICAL DATA:  Mesenteric ischemia, acute EXAM: CTA ABDOMEN AND PELVIS WITHOUT AND WITH CONTRAST TECHNIQUE: Multidetector CT imaging of the abdomen and pelvis was performed using the standard protocol during bolus administration of intravenous contrast. Multiplanar reconstructed images and MIPs were obtained and reviewed to evaluate the vascular anatomy. RADIATION DOSE REDUCTION: This exam was performed according to the departmental dose-optimization program which includes automated exposure control, adjustment of the mA and/or kV according to patient size and/or use of iterative reconstruction technique. CONTRAST:  72m OMNIPAQUE IOHEXOL 350 MG/ML SOLN COMPARISON:  CTA PE, 04/11/2022.  Chest XR, 06/11/2022. FINDINGS: Suboptimal evaluation, secondary to motion degradation. VASCULAR Aorta: Mild-to-moderate burden of aortoiliac atherosclerosis. Normal caliber aorta without aneurysm, dissection, vasculitis or significant stenosis. Celiac: Patent without evidence of aneurysm, dissection, vasculitis or significant stenosis. SMA: Patent without evidence of aneurysm, dissection, vasculitis or significant stenosis. Renals: Single renal arteries are present. Both renal arteries are patent without evidence of aneurysm, dissection, vasculitis, fibromuscular dysplasia or significant stenosis. IMA: Patent without evidence of aneurysm, dissection, vasculitis or significant stenosis. Pelvis: Patent without evidence of aneurysm, dissection, vasculitis or significant stenosis. Proximal Outflow: Bilateral common femoral and visualized portions of the superficial and profunda femoral arteries are patent without evidence of aneurysm, dissection,  vasculitis or significant stenosis. Veins: No obvious venous abnormality within the limitations of this arterial phase study. Review of the MIP images confirms the above findings. NON-VASCULAR Lower chest: No acute abnormality. Severe burden multivessel coronary atherosclerosis. Hepatobiliary: No focal liver abnormality is seen. No gallstones, gallbladder wall thickening, or biliary dilatation. Pancreas: No pancreatic ductal dilatation or surrounding inflammatory changes. Spleen: No pancreatic ductal dilatation or surrounding inflammatory changes. Adrenals/Urinary Tract: Adrenal glands are unremarkable. Multiple small RIGHT renal cortical hypodensities, largest a conglomerate at the midpole measuring 2.0 cm however incompletely characterized. Kidneys are otherwise normal, without renal calculi, focal lesion, or hydronephrosis. Bladder is unremarkable. Stomach/Bowel: Stomach is within normal limits. Appendix is not definitively visualized. No evidence of bowel wall thickening, distention, or inflammatory changes. Liquid stool within colon. Lymphatic: No enlarged abdominal or pelvic lymph nodes. Reproductive: Prostate is unremarkable. Other: No abdominal wall hernia or abnormality. No abdominopelvic ascites. Musculoskeletal: Multilevel degenerative changes of the spine. No acute or significant osseous findings. IMPRESSION: VASCULAR 1. Patent mesenteric arteries. No CTA evidence of vascular compromise to bowel. 2. Aortic Atherosclerosis (ICD10-I70.0) and severe burden of coronary atherosclerosis. NON-VASCULAR 1. No acute abdominopelvic process. 2. Liquid stool within the colon, correlate for diarrhea. 3. 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 No follow-up imaging is recommended. JACR 2018 Feb; 264-273, Management of the Incidental Renal Mass on CT, [employer] 2021; 814-848, Bosniak Classification of Cystic Renal Masses, Version 2019. Electronically Signed  By: Michaelle Birks M.D.   On: 09/05/2022 07:40      Subjective: Patient seen examined bedside, resting comfortably.  Sitting at edge of bed.  Assisted with interpretation by video interpreter Rebeka [phone].  Reports less blood in stool, only reports "speck" during bowel movement this morning.  Seen by GI and okay for discharge home.  May use Imodium as needed.  Patient requesting referral for GI for colonoscopy cancer screening.  Updated patient's brother via FaceTime in patient's room this morning.  No other questions or concerns at this time.  Denies headache, no dizziness, no chest pain, palpitations, no shortness of breath, no abdominal pain, no fever/chills/night sweats, no nausea/vomiting, no focal weakness, no fatigue, no paresthesias.  No acute events overnight per nursing staff.  Discharge Exam: Vitals:   09/07/22 0517 09/07/22 0808  BP: 121/82 131/75  Pulse: (!) 58 63  Resp: 18 18  Temp: (!) 97.4 F (36.3 C) (!) 97.5 F (36.4 C)  SpO2: 99% 97%   Vitals:   09/06/22 1559 09/06/22 2128 09/07/22 0517 09/07/22 0808  BP: 136/62 138/83 121/82 131/75  Pulse: 62 (!) 58 (!) 58 63  Resp: '18 18 18 18  '$ Temp: (!) 97.4 F (36.3 C) 97.9 F (36.6 C) (!) 97.4 F (36.3 C) (!) 97.5 F (36.4 C)  TempSrc: Oral Oral Oral Oral  SpO2: 98% 97% 99% 97%  Weight:      Height:        Physical Exam: GEN: NAD, alert and oriented x 3, wd/wn HEENT: NCAT, PERRL, EOMI, sclera clear, MMM PULM: CTAB w/o wheezes/crackles, normal respiratory effort, on room air CV: RRR w/o M/G/R GI: abd soft, NTND, NABS, no R/G/M MSK: no peripheral edema, muscle strength globally intact 5/5 bilateral upper/lower extremities NEURO: CN II-XII intact, no focal deficits, sensation to light touch intact PSYCH: normal mood/affect Integumentary: dry/intact, no rashes or wounds    The results of significant diagnostics from this hospitalization (including imaging, microbiology, ancillary and laboratory) are listed below for reference.     Microbiology: Recent Results  (from the past 240 hour(s))  C Difficile Quick Screen w PCR reflex     Status: None   Collection Time: 09/05/22  4:12 PM   Specimen: Stool  Result Value Ref Range Status   C Diff antigen NEGATIVE NEGATIVE Final   C Diff toxin NEGATIVE NEGATIVE Final   C Diff interpretation No C. difficile detected.  Final    Comment: Performed at [facility], [place] [address], [place], [place] [address]  Gastrointestinal Panel by PCR , Stool     Status: Abnormal   Collection Time: 09/05/22  4:12 PM   Specimen: Stool  Result Value Ref Range Status   Campylobacter species NOT DETECTED NOT DETECTED Final   Plesimonas shigelloides NOT DETECTED NOT DETECTED Final   Salmonella species NOT DETECTED NOT DETECTED Final   Yersinia enterocolitica NOT DETECTED NOT DETECTED Final   Vibrio species NOT DETECTED NOT DETECTED Final   Vibrio cholerae NOT DETECTED NOT DETECTED Final   Enteroaggregative E coli (EAEC) NOT DETECTED NOT DETECTED Final   Enteropathogenic E coli (EPEC) NOT DETECTED NOT DETECTED Final   Enterotoxigenic E coli (ETEC) NOT DETECTED NOT DETECTED Final   Shiga like toxin producing E coli (STEC) NOT DETECTED NOT DETECTED Final   Shigella/Enteroinvasive E coli (EIEC) NOT DETECTED NOT DETECTED Final   Cryptosporidium NOT DETECTED NOT DETECTED Final   Cyclospora cayetanensis NOT DETECTED NOT DETECTED Final   Entamoeba histolytica NOT DETECTED  NOT DETECTED Final   Giardia lamblia NOT DETECTED NOT DETECTED Final   Adenovirus F40/41 NOT DETECTED NOT DETECTED Final   Astrovirus NOT DETECTED NOT DETECTED Final   Norovirus GI/GII NOT DETECTED NOT DETECTED Final   Rotavirus A NOT DETECTED NOT DETECTED Final   Sapovirus (I, II, IV, and V) DETECTED (A) NOT DETECTED Final    Comment: Performed at [facility], [address] [place], [place] [address]     Labs: BNP (last 3 results) No results for input(s): "BNP" in the last 8760 hours. Basic Metabolic Panel: Recent Labs  Lab  09/05/22 0145 09/05/22 1803 09/06/22 0201  NA 138 136 138  K 3.9 4.0 3.9  CL 106 107 110  CO2 26 20* 21*  GLUCOSE 111* 80 86  BUN '13 11 9  '$ CREATININE 1.07 0.94 0.93  CALCIUM 8.8* 8.8* 8.8*   Liver Function Tests: Recent Labs  Lab 09/05/22 0145  AST 23  ALT 26  ALKPHOS 64  BILITOT 1.2  PROT 7.4  ALBUMIN 3.8   Recent Labs  Lab 09/05/22 0145  LIPASE 39   No results for input(s): "AMMONIA" in the last 168 hours. CBC: Recent Labs  Lab 09/05/22 0145 09/05/22 1005 09/05/22 1803 09/06/22 0201 09/06/22 0856 09/07/22 0314  WBC 3.8*  --   --   --   --  3.3*  HGB 15.5 14.8 15.2 14.2 15.0 14.4  HCT 46.3 44.4 45.4 41.5 43.2 41.8  MCV 94.3  --   --   --   --  92.3  PLT 253  --   --   --   --  210   Cardiac Enzymes: No results for input(s): "CKTOTAL", "CKMB", "CKMBINDEX", "TROPONINI" in the last 168 hours. BNP: Invalid input(s): "POCBNP" CBG: No results for input(s): "GLUCAP" in the last 168 hours. D-Dimer No results for input(s): "DDIMER" in the last 72 hours. Hgb A1c No results for input(s): "HGBA1C" in the last 72 hours. Lipid Profile No results for input(s): "CHOL", "HDL", "LDLCALC", "TRIG", "CHOLHDL", "LDLDIRECT" in the last 72 hours. Thyroid function studies No results for input(s): "TSH", "T4TOTAL", "T3FREE", "THYROIDAB" in the last 72 hours.  Invalid input(s): "FREET3" Anemia work up No results for input(s): "VITAMINB12", "FOLATE", "FERRITIN", "TIBC", "IRON", "RETICCTPCT" in the last 72 hours. Urinalysis    Component Value Date/Time   COLORURINE YELLOW 09/05/2022 0147   APPEARANCEUR CLEAR 09/05/2022 0147   LABSPEC 1.021 09/05/2022 0147   PHURINE 5.0 09/05/2022 0147   GLUCOSEU NEGATIVE 09/05/2022 0147   HGBUR NEGATIVE 09/05/2022 0147   BILIRUBINUR NEGATIVE 09/05/2022 0147   KETONESUR NEGATIVE 09/05/2022 0147   PROTEINUR NEGATIVE 09/05/2022 0147   NITRITE NEGATIVE 09/05/2022 0147   LEUKOCYTESUR NEGATIVE 09/05/2022 0147   Sepsis Labs Recent Labs   Lab 09/05/22 0145 09/07/22 0314  WBC 3.8* 3.3*   Microbiology Recent Results (from the past 240 hour(s))  C Difficile Quick Screen w PCR reflex     Status: None   Collection Time: 09/05/22  4:12 PM   Specimen: Stool  Result Value Ref Range Status   C Diff antigen NEGATIVE NEGATIVE Final   C Diff toxin NEGATIVE NEGATIVE Final   C Diff interpretation No C. difficile detected.  Final    Comment: Performed at [facility], [place] [address], [place], [place] [address]  Gastrointestinal Panel by PCR , Stool     Status: Abnormal   Collection Time: 09/05/22  4:12 PM   Specimen: Stool  Result Value Ref Range Status  Campylobacter species NOT DETECTED NOT DETECTED Final   Plesimonas shigelloides NOT DETECTED NOT DETECTED Final   Salmonella species NOT DETECTED NOT DETECTED Final   Yersinia enterocolitica NOT DETECTED NOT DETECTED Final   Vibrio species NOT DETECTED NOT DETECTED Final   Vibrio cholerae NOT DETECTED NOT DETECTED Final   Enteroaggregative E coli (EAEC) NOT DETECTED NOT DETECTED Final   Enteropathogenic E coli (EPEC) NOT DETECTED NOT DETECTED Final   Enterotoxigenic E coli (ETEC) NOT DETECTED NOT DETECTED Final   Shiga like toxin producing E coli (STEC) NOT DETECTED NOT DETECTED Final   Shigella/Enteroinvasive E coli (EIEC) NOT DETECTED NOT DETECTED Final   Cryptosporidium NOT DETECTED NOT DETECTED Final   Cyclospora cayetanensis NOT DETECTED NOT DETECTED Final   Entamoeba histolytica NOT DETECTED NOT DETECTED Final   Giardia lamblia NOT DETECTED NOT DETECTED Final   Adenovirus F40/41 NOT DETECTED NOT DETECTED Final   Astrovirus NOT DETECTED NOT DETECTED Final   Norovirus GI/GII NOT DETECTED NOT DETECTED Final   Rotavirus A NOT DETECTED NOT DETECTED Final   Sapovirus (I, II, IV, and V) DETECTED (A) NOT DETECTED Final    Comment: Performed at [facility], [address], [place], [place] [address]     Time coordinating discharge: Over 30  minutes  SIGNED:   Tanysha Quant J [place] DO  [facility] 09/07/2022, 2:58 PM

## 2022-09-07 NOTE — Plan of Care (Signed)

## 2022-09-07 NOTE — Progress Notes (Signed)
DISCHARGE NOTE HOME Bobby Griffith. to be discharged Home per MD order. Interpreter services utilized, interpreter [place], number [phone]. Discussed prescriptions and follow up appointments with the patient. F/U appointment made for patient with primary for March 14th, 2024 at 11:20 am. Prescriptions given to patient; medication list explained in detail. Patient verbalized understanding.  Skin clean, dry and intact without evidence of skin break down, no evidence of skin tears noted. IV catheter discontinued intact. Site without signs and symptoms of complications. Dressing and pressure applied. Pt denies pain at the site currently. No complaints noted.  Patient free of lines, drains, and wounds.   An After Visit Summary (AVS) was printed and given to the patient. Patient escorted via wheelchair, and discharged home via private auto.  Anastasio Auerbach, RN

## 2022-09-07 NOTE — TOC Transition Note (Signed)
Transition of Care [facility]) - CM/SW Discharge Note   Patient Details  Name: Bobby Griffith. MRN: [id number] Date of Birth: [birth year]  Transition of Care [facility]) CM/SW Contact:  Tom-Johnson, Renea Ee, RN Phone Number: 09/07/2022, 3:02 PM   Clinical Narrative:     Patient is scheduled for discharge today. Readmission Prevention Assessment done. Outpatient referral, hospital f/u and discharge instructions on AVS.  No TOC needs or recommendations noted.  Family to transport at discharge. No further TOC needs noted.   Final next level of care: Home/Self Care Barriers to Discharge: Barriers Resolved   Patient Goals and CMS Choice CMS Medicare.gov Compare Post Acute Care list provided to:: Patient Choice offered to / list presented to : NA  Discharge Placement                  Patient to be transferred to facility by: Family      Discharge Plan and Services Additional resources added to the After Visit Summary for                  DME Arranged: N/A DME Agency: NA       HH Arranged: NA HH Agency: NA        Social Determinants of Health (SDOH) Interventions SDOH Screenings   Food Insecurity: No Food Insecurity (09/05/2022)  Housing: Low Risk  (09/05/2022)  Transportation Needs: No Transportation Needs (09/05/2022)  Utilities: Not At Risk (09/05/2022)  Depression (PHQ2-9): Low Risk  (08/04/2022)  Tobacco Use: Low Risk  (09/05/2022)     Readmission Risk Interventions     No data to display

## 2022-09-08 ENCOUNTER — Telehealth: Payer: Self-pay

## 2022-09-08 LAB — NOROVIRUS GROUP 1 & 2 BY PCR, STOOL
Norovirus 1 by PCR: NEGATIVE
Norovirus 2  by PCR: NEGATIVE

## 2022-09-08 NOTE — Transitions of Care (Post Inpatient/ED Visit) (Signed)
   09/08/2022  Name: Bobby Griffith. MRN: [id number] DOB: 1950-10-27  Today's TOC FU Call Status: Today's TOC FU Call Status:: Unsuccessul Call (1st Attempt) Unsuccessful Call (1st Attempt) Date: 09/08/22  Attempted to reach the patient regarding the most recent Inpatient/ED visit.  Follow Up Plan: Additional outreach attempts will be made to reach the patient to complete the Transitions of Care (Post Inpatient/ED visit) call.   [name], RN, BSN, CCM Care Management Coordinator [facility] Phone: [phone]: [phone]

## 2022-09-09 ENCOUNTER — Encounter: Payer: Self-pay | Admitting: Nurse Practitioner

## 2022-09-09 ENCOUNTER — Ambulatory Visit (INDEPENDENT_AMBULATORY_CARE_PROVIDER_SITE_OTHER): Payer: 59 | Admitting: Nurse Practitioner

## 2022-09-09 ENCOUNTER — Telehealth: Payer: Self-pay

## 2022-09-09 VITALS — BP 132/78 | HR 81 | Temp 98.3°F | Ht 69.0 in | Wt 153.4 lb

## 2022-09-09 DIAGNOSIS — I10 Essential (primary) hypertension: Secondary | ICD-10-CM

## 2022-09-09 DIAGNOSIS — K219 Gastro-esophageal reflux disease without esophagitis: Secondary | ICD-10-CM | POA: Diagnosis not present

## 2022-09-09 DIAGNOSIS — A084 Viral intestinal infection, unspecified: Secondary | ICD-10-CM

## 2022-09-09 LAB — CBC WITH DIFFERENTIAL/PLATELET
Basophils Absolute: 0 10*3/uL (ref 0.0–0.1)
Basophils Relative: 0.2 % (ref 0.0–3.0)
Eosinophils Absolute: 0 10*3/uL (ref 0.0–0.7)
Eosinophils Relative: 0.4 % (ref 0.0–5.0)
HCT: 44.8 % (ref 39.0–52.0)
Hemoglobin: 15.2 g/dL (ref 13.0–17.0)
Lymphocytes Relative: 24.5 % (ref 12.0–46.0)
Lymphs Abs: 1 10*3/uL (ref 0.7–4.0)
MCHC: 34 g/dL (ref 30.0–36.0)
MCV: 94.2 fl (ref 78.0–100.0)
Monocytes Absolute: 0.3 10*3/uL (ref 0.1–1.0)
Monocytes Relative: 6.5 % (ref 3.0–12.0)
Neutro Abs: 2.9 10*3/uL (ref 1.4–7.7)
Neutrophils Relative %: 68.4 % (ref 43.0–77.0)
Platelets: 262 10*3/uL (ref 150.0–400.0)
RBC: 4.75 Mil/uL (ref 4.22–5.81)
RDW: 14 % (ref 11.5–15.5)
WBC: 4.3 10*3/uL (ref 4.0–10.5)

## 2022-09-09 LAB — COMPREHENSIVE METABOLIC PANEL
ALT: 20 U/L (ref 0–53)
AST: 17 U/L (ref 0–37)
Albumin: 4.2 g/dL (ref 3.5–5.2)
Alkaline Phosphatase: 73 U/L (ref 39–117)
BUN: 13 mg/dL (ref 6–23)
CO2: 29 mEq/L (ref 19–32)
Calcium: 9.4 mg/dL (ref 8.4–10.5)
Chloride: 103 mEq/L (ref 96–112)
Creatinine, Ser: 1.08 mg/dL (ref 0.40–1.50)
GFR: 69.06 mL/min (ref >60.00)
Glucose, Bld: 109 mg/dL — ABNORMAL HIGH (ref 70–99)
Potassium: 3.9 mEq/L (ref 3.5–5.1)
Sodium: 139 mEq/L (ref 135–145)
Total Bilirubin: 1.5 mg/dL — ABNORMAL HIGH (ref 0.2–1.2)
Total Protein: 7.6 g/dL (ref 6.0–8.3)

## 2022-09-09 NOTE — Patient Instructions (Signed)
It was great to see you!  We are checking your labs today and will let you know the results via mychart/phone.   Call [facility] to schedule an appointment:  [phone]   Let's follow-up at your next scheduled appointment, sooner if you have concerns.  If a referral was placed today, you will be contacted for an appointment. Please note that routine referrals can sometimes take up to 3-4 weeks to process. Please call our office if you haven't heard anything after this time frame.  Take care,  Vance Peper, NP

## 2022-09-09 NOTE — Progress Notes (Signed)
Established Patient Office Visit  Subjective   Patient ID: Bobby Griffith., male    DOB: 1950/08/28  Age: 72 y.o. MRN: [id number]  Chief Complaint  Patient presents with   Hospitalization Follow-up    Bloody diarrhea went to ED on 09/05/22 and was admitted    HPI  Bobby Griffith. Is here to follow-up after hospitalization for hematochezia.   He states that he woke up in the middle of the night on 09/05/22 and noted a bloody bowel movement. This was associated with diarrhea. He called his brother who took him to the ER. He was admitted from 09/05/22-09/07/22. He states that since has been home, he has not had any abdominal pain, fevers, or blood in his stool.   Transition of Care Hospital Follow up.   Hospital/Facility: [facility] D/C Physician: Eric [place] DO D/C Date: 09/07/22  Records Requested: 09/09/22 Records Received: 09/09/22 Records Reviewed: 09/09/22  Diagnoses on Discharge: Acute viral gastroenteritis, hypertension, GERD  Date of interactive Contact within 48 hours of discharge:  Contact was through: direct  Date of 7 day or 14 day face-to-face visit:    within 7 days  Outpatient Encounter Medications as of 09/09/2022  Medication Sig   losartan (COZAAR) 25 MG tablet Take 1 tablet (25 mg total) by mouth daily.   loperamide (IMODIUM) 2 MG capsule Take 1 capsule (2 mg total) by mouth as needed for diarrhea or loose stools. (Patient not taking: Reported on 09/09/2022)   pantoprazole (PROTONIX) 40 MG tablet Take 1 tablet (40 mg total) by mouth daily. (Patient not taking: Reported on 09/09/2022)   No facility-administered encounter medications on file as of 09/09/2022.    Diagnostic Tests Reviewed/Disposition: chart reviewed  Consults: GI on outpatient  Discharge Instructions - Follow-up with PCP in 1-2 weeks - Referral placed to GI for screening colonoscopy  - Imodium prn diarrhea - Low sodium heart healthy diet  Disease/illness Education: Reviewed  with patient  Home Health/Community Services Discussions/Referrals: N/A  Establishment or re-establishment of referral orders for community resources: N/A  Discussion with other health care providers: Reviewed notes  Assessment and Support of treatment regimen adherence: Discussed with patient  Appointments Coordinated with: GI phone number given to patient to call and schedule appointment.   Education for self-management, independent living, and ADLs: Discussed with patient     ROS See pertinent positives and negatives per HPI.    Objective:     BP 132/78 (BP Location: Right Arm)   Pulse 81   Temp 98.3 F (36.8 C)   Ht '5\' 9"'$  (1.753 m)   Wt 153 lb 6.4 oz (69.6 kg)   SpO2 98%   BMI 22.65 kg/m    Physical Exam Vitals and nursing note reviewed.  Constitutional:      Appearance: Normal appearance.  HENT:     Head: Normocephalic.  Eyes:     Conjunctiva/sclera: Conjunctivae normal.  Cardiovascular:     Rate and Rhythm: Normal rate and regular rhythm.     Pulses: Normal pulses.     Heart sounds: Normal heart sounds.  Pulmonary:     Effort: Pulmonary effort is normal.     Breath sounds: Normal breath sounds.  Abdominal:     General: There is no distension.     Palpations: Abdomen is soft. There is no mass.     Tenderness: There is no abdominal tenderness. There is no guarding.  Musculoskeletal:     Cervical back: Normal range of motion.  Skin:    General: Skin is warm.  Neurological:     General: No focal deficit present.     Mental Status: He is alert and oriented to person, place, and time.  Psychiatric:        Mood and Affect: Mood normal.        Behavior: Behavior normal.        Thought Content: Thought content normal.        Judgment: Judgment normal.      Assessment & Plan:   Problem List Items Addressed This Visit       Cardiovascular and Mediastinum   Primary hypertension    Chronic, stable.  BP today 132/78.  Continue losartan 25 mg daily.   Follow-up in 3 months.        Digestive   Gastroesophageal reflux disease    Chronic, stable.  Continue pantoprazole 40 mg daily.  Discussed about this medication, possible side effects, and when to take it.      Viral gastroenteritis - Primary    Symptoms are improving.  He was admitted on 09/05/2022 through 09/07/2022 and treated with IV fluids.  He did have hematochezia with this event.  He has not had any more blood in his stool since being discharged.  His diarrhea has also stopped.  Will check CMP and CBC today.  Discussed that he was referred to GI for colonoscopy.  Also gave him the phone number to call if he does not hear from them to schedule an appointment.  He can continue Imodium 2 mg as needed for diarrhea.  Had a long discussion about the 3 different medications he is taking, possible side effects and when to take them.      Relevant Orders   CBC with Differential/Platelet   Comprehensive metabolic panel    Return if symptoms worsen or fail to improve.    Charyl Dancer, NP

## 2022-09-09 NOTE — Assessment & Plan Note (Signed)
Chronic, stable.  Continue pantoprazole 40 mg daily.  Discussed about this medication, possible side effects, and when to take it.

## 2022-09-09 NOTE — Assessment & Plan Note (Signed)
Symptoms are improving.  He was admitted on 09/05/2022 through 09/07/2022 and treated with IV fluids.  He did have hematochezia with this event.  He has not had any more blood in his stool since being discharged.  His diarrhea has also stopped.  Will check CMP and CBC today.  Discussed that he was referred to GI for colonoscopy.  Also gave him the phone number to call if he does not hear from them to schedule an appointment.  He can continue Imodium 2 mg as needed for diarrhea.  Had a long discussion about the 3 different medications he is taking, possible side effects and when to take them.

## 2022-09-09 NOTE — Assessment & Plan Note (Signed)
Chronic, stable.  BP today 132/78.  Continue losartan 25 mg daily.  Follow-up in 3 months.

## 2022-09-09 NOTE — Transitions of Care (Post Inpatient/ED Visit) (Signed)
   09/09/2022  Name: Bobby Griffith. MRN: [id number] DOB: 05/11/1951  Today's TOC FU Call Status: Today's TOC FU Call Status:: Unsuccessful Call (2nd Attempt) Unsuccessful Call (2nd Attempt) Date: 09/09/22  Attempted to reach the patient regarding the most recent Inpatient/ED visit.  Follow Up Plan: Additional outreach attempts will be made to reach the patient to complete the Transitions of Care (Post Inpatient/ED visit) call.   [name], RN, BSN, CCM Care Management Coordinator [place] Phone: [phone]: [phone]

## 2022-09-10 ENCOUNTER — Telehealth: Payer: Self-pay

## 2022-09-10 NOTE — Transitions of Care (Post Inpatient/ED Visit) (Signed)
   09/10/2022  Name: Bobby Griffith. MRN: [id number] DOB: 20-Jun-1951  Today's TOC FU Call Status: Today's TOC FU Call Status:: Unsuccessful Call (3rd Attempt) Unsuccessful Call (3rd Attempt) Date: 09/10/22  Attempted to reach the patient regarding the most recent Inpatient/ED visit.  Follow Up Plan: No further outreach attempts will be made at this time. We have been unable to contact the patient.  [name], RN, BSN, CCM Care Management Coordinator [place] Phone: [phone]: [phone]

## 2022-09-23 ENCOUNTER — Telehealth: Payer: Self-pay | Admitting: Nurse Practitioner

## 2022-09-23 NOTE — Telephone Encounter (Signed)
Pt is needing a 100 day supply of his losartan (COZAAR) 25 MG tablet [id number]. This is what his insurance will cover.   [id number] [address], [address] [place] [address] Phone: [phone]  Fax: [phone] DEA #: [id number]

## 2022-09-27 MED ORDER — LOSARTAN POTASSIUM 25 MG PO TABS: 25.0000 mg | ORAL_TABLET | Freq: Every day | ORAL | 1 refills | Status: DC

## 2022-09-27 NOTE — Telephone Encounter (Signed)
Patient notified that Rx refill request was sent into pharmacy

## 2022-09-27 NOTE — Addendum Note (Signed)
Addended by: Vance Peper A on: 09/27/2022 12:13 PM   Modules accepted: Orders

## 2022-10-06 ENCOUNTER — Telehealth: Payer: Self-pay | Admitting: Nurse Practitioner

## 2022-10-06 NOTE — Telephone Encounter (Signed)
Called patient to schedule Medicare Annual Wellness Visit (AWV). Left message for patient to call back and schedule Medicare Annual Wellness Visit (AWV).  Last date of AWV:  DUE 06/28/2009 AWVI PER [facility]   Please schedule an appointment at any time with [facility]  [place].  If any questions, please contact me at [phone].  Thank you ,  Cyle Kenyon [facility] AWV direct phone # [phone] 

## 2022-10-21 ENCOUNTER — Telehealth: Payer: Self-pay | Admitting: Nurse Practitioner

## 2022-10-21 NOTE — Telephone Encounter (Signed)
Called patient to schedule Medicare Annual Wellness Visit (AWV). Left message for patient to call back and schedule Medicare Annual Wellness Visit (AWV).  Last date of AWV: DUE 06/28/2009 AWVI PER [facility]  Please schedule an appointment at any time with [facility].  If any questions, please contact me at [phone].  Thank you ,  Rudell Cobb AWV direct phone # [phone]

## 2022-11-16 ENCOUNTER — Ambulatory Visit (INDEPENDENT_AMBULATORY_CARE_PROVIDER_SITE_OTHER): Payer: 59 | Admitting: Nurse Practitioner

## 2022-11-16 ENCOUNTER — Encounter: Payer: Self-pay | Admitting: Nurse Practitioner

## 2022-11-16 VITALS — BP 132/84 | HR 60 | Temp 97.8°F | Ht 69.0 in | Wt 157.4 lb

## 2022-11-16 DIAGNOSIS — I10 Essential (primary) hypertension: Secondary | ICD-10-CM

## 2022-11-16 NOTE — Patient Instructions (Signed)
It was great to see you!  Keep taking your medications.  Call your pharmacy when you are almost out and need a refill.   Next time you go to your pharmacy, see if you still need your second shingles vaccine  Let's follow-up in 6 months, sooner if you have concerns.  If a referral was placed today, you will be contacted for an appointment. Please note that routine referrals can sometimes take up to 3-4 weeks to process. Please call our office if you haven't heard anything after this time frame.  Take care,  Rodman Pickle, NP

## 2022-11-16 NOTE — Progress Notes (Addendum)
   Established Patient Office Visit  Subjective   Patient ID: Bobby Kwasniewski., male    DOB: [birth year]  Age: 72 y.o. MRN: [id number]  Chief Complaint  Patient presents with   Hypertension    No concerns  Visit completed with in-person interpreter   HPI  Bobby Orn. Is here to follow-up on hypertension.  HYPERTENSION  Hypertension status: controlled  Satisfied with current treatment? yes Duration of hypertension: chronic BP monitoring frequency:  not checking BP range: n/a BP medication side effects:  no Medication compliance: excellent compliance Previous BP meds: losartan Aspirin: no Recurrent headaches: no Visual changes: no Palpitations: no Dyspnea: no Chest pain: no Lower extremity edema: no Dizzy/lightheaded: no     ROS See pertinent positives and negatives per HPI.    Objective:     BP 132/84 (BP Location: Left Arm, Cuff Size: Normal)   Pulse 60   Temp 97.8 F (36.6 C)   Ht 5\' 9"  (1.753 m)   Wt 157 lb 6.4 oz (71.4 kg)   SpO2 97%   BMI 23.24 kg/m    Physical Exam Vitals and nursing note reviewed.  Constitutional:      Appearance: Normal appearance.  HENT:     Head: Normocephalic.  Eyes:     Conjunctiva/sclera: Conjunctivae normal.  Cardiovascular:     Rate and Rhythm: Normal rate and regular rhythm.     Pulses: Normal pulses.     Heart sounds: Normal heart sounds.  Pulmonary:     Effort: Pulmonary effort is normal.     Breath sounds: Normal breath sounds.  Musculoskeletal:     Cervical back: Normal range of motion.  Skin:    General: Skin is warm.  Neurological:     General: No focal deficit present.     Mental Status: Bobby Griffith is alert and oriented to person, place, and time.  Psychiatric:        Mood and Affect: Mood normal.        Behavior: Behavior normal.        Thought Content: Thought content normal.        Judgment: Judgment normal.     The 10-year ASCVD risk score (Arnett DK, et al., 2019) is: 21%    Assessment  & Plan:   Problem List Items Addressed This Visit       Cardiovascular and Mediastinum   Primary hypertension - Primary    Chronic, stable. Continue losartan 25 mg daily. Recent labs reviewed. Follow-up in 6 months.        Return in about 6 months (around 05/19/2023) for CPE.    Gerre Scull, NP

## 2022-11-16 NOTE — Assessment & Plan Note (Signed)
Chronic, stable. Continue losartan 25 mg daily. Recent labs reviewed. Follow-up in 6 months.

## 2023-02-15 ENCOUNTER — Telehealth: Payer: Self-pay

## 2023-02-15 NOTE — Telephone Encounter (Signed)
Pt came into the office with concerns about possibly missing a HTN FU appt this month. Informed pt that his last appt was in May and according to PCP OV note, wants pt to return for Physical in November 2024, which is scheduled. Asked pt if he wants to schedule a sooner appt to FU on BP, pt declined.   Pt given card with appt date and time on it. We also signed the pt up for MyChart. I showed the pt how to view appointments, send messages to PCP, see lab results, and schedule appointments.   No further questions or concerns expressed at this time.

## 2023-04-01 ENCOUNTER — Telehealth: Payer: Self-pay | Admitting: Nurse Practitioner

## 2023-04-01 NOTE — Telephone Encounter (Signed)
Pt want to know when was the last time he had a TB test. Pt need proof or documents by today so he can give to his boss.

## 2023-04-01 NOTE — Telephone Encounter (Signed)
Pt returned call and was told A Tb test was not logged. He understood and hung up.

## 2023-04-01 NOTE — Telephone Encounter (Signed)
I called patient and left a message to return call.

## 2023-04-06 ENCOUNTER — Encounter: Payer: Self-pay | Admitting: Nurse Practitioner

## 2023-04-06 ENCOUNTER — Ambulatory Visit (INDEPENDENT_AMBULATORY_CARE_PROVIDER_SITE_OTHER): Payer: Medicare HMO | Admitting: Nurse Practitioner

## 2023-04-06 VITALS — BP 136/70 | HR 80 | Temp 97.3°F | Ht 69.0 in | Wt 163.4 lb

## 2023-04-06 DIAGNOSIS — Z23 Encounter for immunization: Secondary | ICD-10-CM

## 2023-04-06 DIAGNOSIS — K219 Gastro-esophageal reflux disease without esophagitis: Secondary | ICD-10-CM

## 2023-04-06 DIAGNOSIS — I10 Essential (primary) hypertension: Secondary | ICD-10-CM | POA: Diagnosis not present

## 2023-04-06 NOTE — Progress Notes (Signed)
   Established Patient Office Visit  Subjective   Patient ID: Greyson Peavy., male    DOB: 08-31-1950  Age: 72 y.o. MRN: [id number]  Chief Complaint  Patient presents with   Hypertension    Pt seen at urgent care on 04/05/2023, BP elevated. Pt here following up.     HPI  Linus Orn. Is here to follow-up on hypertension.   He states that he went to urgent care yesterday, and his blood pressure was elevated at 177/80. He states that they did not repeat his blood pressure at that visit. He has been taking his losartan 25mg  daily. He denies chest pain, shortness of breath, and headaches. He does not check his blood pressure at home.     ROS See pertinent positives and negatives per HPI.    Objective:     BP 136/70 (BP Location: Left Arm, Cuff Size: Large)   Pulse 80   Temp (!) 97.3 F (36.3 C) (Temporal)   Ht 5\' 9"  (1.753 m)   Wt 163 lb 6.4 oz (74.1 kg)   SpO2 98%   BMI 24.13 kg/m    Physical Exam Vitals and nursing note reviewed.  Constitutional:      Appearance: Normal appearance.  HENT:     Head: Normocephalic.  Eyes:     Conjunctiva/sclera: Conjunctivae normal.  Cardiovascular:     Rate and Rhythm: Normal rate and regular rhythm.     Pulses: Normal pulses.     Heart sounds: Normal heart sounds.  Pulmonary:     Effort: Pulmonary effort is normal.     Breath sounds: Normal breath sounds.  Musculoskeletal:     Cervical back: Normal range of motion.  Skin:    General: Skin is warm.  Neurological:     General: No focal deficit present.     Mental Status: He is alert and oriented to person, place, and time.  Psychiatric:        Mood and Affect: Mood normal.        Behavior: Behavior normal.        Thought Content: Thought content normal.        Judgment: Judgment normal.    The 10-year ASCVD risk score (Arnett DK, et al., 2019) is: 22.8%    Assessment & Plan:   Problem List Items Addressed This Visit       Cardiovascular and Mediastinum    Primary hypertension - Primary    Chronic, stable. Continue losartan 25mg  daily. His blood pressure was not repeated at the urgent care, and with his signing, it typically is elevated until he sits down for a few minutes. Follow-up in 6 months.         Digestive   Gastroesophageal reflux disease    Chronic, stable. Continue protonix 40mg  daily.       Other Visit Diagnoses     Immunization due       Flu and prevnar 20 given today.   Relevant Orders   Pneumococcal conjugate vaccine 20-valent   Flu Vaccine Trivalent High Dose (Fluad)       Return in about 6 months (around 10/05/2023) for CPE.    Gerre Scull, NP

## 2023-04-06 NOTE — Assessment & Plan Note (Signed)
Chronic, stable. Continue losartan 25mg  daily. His blood pressure was not repeated at the urgent care, and with his signing, it typically is elevated until he sits down for a few minutes. Follow-up in 6 months.

## 2023-04-06 NOTE — Assessment & Plan Note (Signed)
Chronic, stable. Continue protonix 40mg  daily.

## 2023-04-06 NOTE — Patient Instructions (Signed)
It was great to see you!  Keep taking your medications like you are.   We have given you your flu and pneumonia vaccines today.   Let's follow-up in 6 months, sooner if you have concerns.  If a referral was placed today, you will be contacted for an appointment. Please note that routine referrals can sometimes take up to 3-4 weeks to process. Please call our office if you haven't heard anything after this time frame.  Take care,  Rodman Pickle, NP

## 2023-04-23 ENCOUNTER — Other Ambulatory Visit: Payer: Self-pay | Admitting: Nurse Practitioner

## 2023-04-25 NOTE — Telephone Encounter (Signed)
Requesting: LOSARTAN POTASSIUM 25 MG TAB  Last Visit: 04/06/2023 Next Visit: 05/20/2023 Last Refill: 09/27/2022  Please Advise

## 2023-05-04 ENCOUNTER — Ambulatory Visit: Payer: Medicare HMO | Admitting: Nurse Practitioner

## 2023-05-20 ENCOUNTER — Telehealth: Payer: Self-pay | Admitting: Nurse Practitioner

## 2023-05-20 ENCOUNTER — Encounter: Payer: Medicare HMO | Admitting: Nurse Practitioner

## 2023-05-20 NOTE — Telephone Encounter (Signed)
11.22.24 no show/medicaid

## 2023-05-23 NOTE — Telephone Encounter (Signed)
Noted  

## 2023-05-23 NOTE — Telephone Encounter (Signed)
1st no show, sent letter via Earleen Reaper, sent text to reschedule

## 2023-11-13 ENCOUNTER — Other Ambulatory Visit: Payer: Self-pay | Admitting: Nurse Practitioner

## 2023-11-14 NOTE — Telephone Encounter (Signed)
 Requesting: LOSARTAN  POTASSIUM 25 MG TAB  Last Visit: 04/06/2023 Next Visit: Visit date not found Last Refill: 04/25/2023  Please Advise

## 2023-12-10 ENCOUNTER — Other Ambulatory Visit: Payer: Self-pay | Admitting: Nurse Practitioner

## 2023-12-13 NOTE — Telephone Encounter (Signed)
 Requesting: LOSARTAN  POTASSIUM 25 MG TAB  Last Visit: 04/06/2023 Next Visit: Visit date not found Last Refill: 11/14/2023 Due for an appointment  Please Advise

## 2024-01-11 ENCOUNTER — Other Ambulatory Visit: Payer: Self-pay | Admitting: Nurse Practitioner

## 2024-01-11 NOTE — Telephone Encounter (Signed)
 Requesting: LOSARTAN  POTASSIUM 25 MG TAB  Last Visit: 04/06/2023 Next Visit: Visit date not found Last Refill: 12/13/2023 Patient is due for an appointment.   Please Advise   I tried to call patient and no answerer and no voicemail.

## 2024-01-15 ENCOUNTER — Other Ambulatory Visit: Payer: Self-pay | Admitting: Nurse Practitioner

## 2024-01-18 NOTE — Telephone Encounter (Signed)
 Requesting: LOSARTAN  POTASSIUM 25 MG TAB  Last Visit: 04/06/2023 Next Visit: Visit date not found Last Refill: 04/06/2023  patient is due for an appointment.  Please Advise

## 2024-01-18 NOTE — Telephone Encounter (Signed)
 Left message for patient to return call.

## 2024-02-13 ENCOUNTER — Other Ambulatory Visit: Payer: Self-pay | Admitting: Nurse Practitioner

## 2024-02-15 ENCOUNTER — Ambulatory Visit: Payer: Self-pay

## 2024-02-15 ENCOUNTER — Encounter: Payer: Self-pay | Admitting: Family Medicine

## 2024-02-15 ENCOUNTER — Ambulatory Visit: Admitting: Family Medicine

## 2024-02-15 VITALS — BP 140/86 | HR 65 | Temp 97.8°F | Resp 18 | Wt 164.8 lb

## 2024-02-15 DIAGNOSIS — M25511 Pain in right shoulder: Secondary | ICD-10-CM | POA: Diagnosis not present

## 2024-02-15 MED ORDER — KETOROLAC TROMETHAMINE 10 MG PO TABS: 10.0000 mg | ORAL_TABLET | Freq: Four times a day (QID) | ORAL | 0 refills | Status: AC | PRN

## 2024-02-15 MED ORDER — PANTOPRAZOLE SODIUM 40 MG PO TBEC: 40.0000 mg | DELAYED_RELEASE_TABLET | Freq: Every day | ORAL | 2 refills | Status: DC

## 2024-02-15 MED ORDER — CYCLOBENZAPRINE HCL 10 MG PO TABS: 10.0000 mg | ORAL_TABLET | Freq: Every evening | ORAL | 0 refills | Status: AC | PRN

## 2024-02-15 MED ORDER — KETOROLAC TROMETHAMINE 60 MG/2ML IM SOLN
60.0000 mg | Freq: Once | INTRAMUSCULAR | Status: AC
  Administered 2024-02-15: 60 mg via INTRAMUSCULAR

## 2024-02-15 NOTE — Telephone Encounter (Signed)
 Patient called with an interpreter and wanted to be seen her today by Tinnie but she does not have any available appointments. Patient refused going to ED or urgent care as advised by nurse triage. We got approval by Dr. Sebastian for today at 11am and patient was scheduled.

## 2024-02-15 NOTE — Progress Notes (Signed)
 Assessment & Plan   Assessment/Plan:     Assessment & Plan Acute right shoulder pain Acute right shoulder pain for six days, radiating to the neck, likely due to muscle strain from lifting heavy objects. Pain is throbbing, worsens with certain movements and during sleep. Differential includes rotator cuff injury, cervical degenerative disease, or suprascapular strain. Pain is not alleviated by Advil  or over-the-counter pain creams. No significant history of neck problems, though mild to moderate degenerative disc disease noted in past imaging. 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 - Administer ketorolac  60 mg intramuscularly for immediate pain relief. - Prescribe oral ketorolac  10 mg for ongoing pain management, to be taken every six hours as needed. - Prescribe pantoprazole  40 mg daily to protect the stomach from ketorolac . - Prescribe cyclobenzaprine  10 mg as a muscle relaxant, to be taken at night as needed you to potential sedative effects. - Offered on light duty at work if needed, and provide a note for work accommodations.,  However declined by patient - Instruct to return if pain persists beyond a week for further evaluation and potential imaging.  Gastroesophageal reflux disease (GERD) GERD, currently not taking pantoprazole . Potential for ketorolac  to exacerbate GERD symptoms, necessitating stomach protection. - Prescribe pantoprazole  40 mg daily for one week to protect against potential gastrointestinal side effects from ketorolac .      Medications Discontinued During This Encounter  Medication Reason   pantoprazole  (PROTONIX ) 40 MG tablet Reorder    Return if symptoms worsen or fail to improve.        Subjective:   Encounter date: 02/15/2024  Bobby Griffith. is a 73 y.o. male who has Erectile dysfunction; Primary hypertension; Pure hypercholesterolemia; Gastroesophageal reflux disease; GI bleed; Hyperlipidemia; Hematochezia;  Diarrhea; and Viral gastroenteritis on their problem list..   He  has a past medical history of Deaf, bilateral and HTN (hypertension).SABRA   He presents with chief complaint of Shoulder Pain (Pt c/o of right shoulder pain for 1 week; pain will radiate to neck. Pt stated lifting a heavy tool box could have caused the discomfort; used OTC Tylenol   with hot/cold compress) .   Discussed the use of AI scribe software for clinical note transcription with the patient, who gave verbal consent to proceed.  History of Present Illness Bobby Griffith. is a 73 year old male who presents with acute right shoulder pain radiating to the neck.  He has been experiencing acute right shoulder pain for the past six days, which radiates to his neck. The pain is described as throbbing and pulsing, with the shoulder pain being more severe than the neck pain. It began after lifting a heavy object. The pain has not improved and is sometimes worse, particularly affecting his sleep as he is unable to lay on his shoulder comfortably. He has tried Advil , warm compresses, and hot showers, which provide minimal relief. Over-the-counter pain creams, including Vicks, have also been ineffective.  No history of neck problems or neck pain. Imaging has shown mild to moderate degenerative disc disease in the lower neck, but he has never been informed of any back issues.  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 Despite the pain, he continues to work as his employer relies on him.  He has a history of reflux disease but is not currently taking any medication for it.     ROS  Past Surgical History:  Procedure  Laterality Date   ABDOMINAL SURGERY     LEFT HEART CATH AND CORONARY ANGIOGRAPHY N/A 02/23/2018   Procedure: LEFT HEART CATH AND CORONARY ANGIOGRAPHY;  Surgeon: Verlin Lonni BIRCH, MD;  Location: [facility];  Service: Cardiovascular;  Laterality: N/A;     Outpatient Medications Prior to Visit  Medication Sig Dispense Refill   losartan  (COZAAR ) 25 MG tablet TAKE 1 TABLET (25 MG TOTAL) BY MOUTH DAILY. 90 tablet 0   loperamide  (IMODIUM ) 2 MG capsule Take 1 capsule (2 mg total) by mouth as needed for diarrhea or loose stools. (Patient not taking: Reported on 02/15/2024) 30 capsule 0   pantoprazole  (PROTONIX ) 40 MG tablet Take 1 tablet (40 mg total) by mouth daily. (Patient not taking: Reported on 02/15/2024) 30 tablet 2   No facility-administered medications prior to visit.    History reviewed. No pertinent family history.  Social History   Socioeconomic History   Marital status: Single    Spouse name: Not on file   Number of children: Not on file   Years of education: Not on file   Highest education level: Not on file  Occupational History   Not on file  Tobacco Use   Smoking status: Never   Smokeless tobacco: Never  Vaping Use   Vaping status: Never Used  Substance and Sexual Activity   Alcohol use: Yes    Comment: socially   Drug use: Never   Sexual activity: Not Currently    Birth control/protection: None  Other Topics Concern   Not on file  Social History Narrative   ** Merged History Encounter **       Social Drivers of Health   Financial Resource Strain: Not on file  Food Insecurity: No Food Insecurity (09/05/2022)   Hunger Vital Sign    Worried About Running Out of Food in the Last Year: Never true    Ran Out of Food in the Last Year: Never true  Transportation Needs: No Transportation Needs (09/05/2022)   PRAPARE - Administrator, Civil Service (Medical): No    Lack of Transportation (Non-Medical): No  Physical Activity: Not on file  Stress: Not on file  Social Connections: Not on file  Intimate Partner Violence: Not At Risk (09/05/2022)   Humiliation, Afraid, Rape, and Kick questionnaire    Fear of Current or Ex-Partner: No    Emotionally Abused: No    Physically Abused: No    Sexually Abused:  No                                                                                                  Objective:  Physical Exam: BP (!) 140/86 (BP Location: Right Arm, Patient Position: Sitting, Cuff Size: Normal) Comment: recheck done manual  Pulse 65   Temp 97.8 F (36.6 C) (Temporal)   Resp 18   Wt 164 lb 12.8 oz (74.8 kg)   SpO2 98%   BMI 24.34 kg/m    Physical Exam GENERAL: Alert, cooperative, well developed, no acute distress. HEENT: Normocephalic, normal oropharynx, moist mucous membranes. CHEST: Clear to auscultation bilaterally,  no wheezes, rhonchi, or crackles. CARDIOVASCULAR: Normal heart rate and rhythm, S1 and S2 normal without murmurs. ABDOMEN: Soft, non-tender, non-distended, without organomegaly, normal bowel sounds. EXTREMITIES: No cyanosis or edema. MUSCULOSKELETAL: Right shoulder pain on posterior, superior position palpation and movement, crepitus in right shoulder, pain with reach behind, neck pain on palpation. NEUROLOGICAL: Cranial nerves grossly intact, moves all extremities without gross motor or sensory deficit.   Physical Exam  No results found.  No results found for this or any previous visit (from the past 2160 hours).      Beverley Adine Hummer, MD, MS

## 2024-02-15 NOTE — Telephone Encounter (Signed)
 Called [facility] to advise them of patient refusing ER   FYI Only or Action Required?: FYI only for provider.  Patient was last seen in primary care on 04/06/2023 by Nedra Tinnie LABOR, NP.  Called Nurse Triage reporting Shoulder Pain.  Symptoms began 5 days ago.  Interventions attempted: Rest, hydration, or home remedies and Ice/heat application.  Symptoms are: rapidly worsening.  Triage Disposition: Go to ED Now (or PCP Triage)  Patient/caregiver understands and will follow disposition?: No             Copied from CRM #[id number]. Topic: Clinical - Red Word Triage >> Feb 15, 2024  8:23 AM Suzen RAMAN wrote: Red Word that prompted transfer to Nurse Triage: right shoulder pain with swelling, pain trouble from shoulder up to neck and ear. unable to sleep as a result Reason for Disposition  [1] SEVERE pain AND [2] not improved 2 hours after pain medicine  Answer Assessment - Initial Assessment Questions Right shoulder Wakes patient up at night several times Started some point last week (5 days ago) Sounds like grinding No injuries This RN advised the patient that the Emergency Room was recommended for severe pain at this time Patient refused the Emergency Room and stated they would fit him in at the office This RN called the [facility] to advise them of ER Refusal and they also stated that there are no openings at the office today and the recommendation is still to go to the Emergency Room . This RN advised the patient of this information and they said, via Sign Language interpreter on the line, Thank you and hung up immediately. This RN had already advised [facility] at PCP office that the patient was refusing.      1. ONSET: When did the pain start?     5 days ago 2. LOCATION: Where is the pain located?     Right shoulder into right side of neck and around right ear 3. PAIN: How bad is the pain? (Scale 1-10; or mild, moderate, severe)     6-8  4. WORK OR EXERCISE: Has there  been any recent work or exercise that involved this part of the body?     Daily use 5. CAUSE: What do you think is causing the shoulder pain?     Unknown 6. OTHER SYMPTOMS: Do you have any other symptoms? (e.g., neck pain, swelling, rash, fever, numbness, weakness)     Patient feels like the back of his shoulder is warm but denies any wounds  Protocols used: Shoulder Pain-A-AH

## 2024-02-15 NOTE — Patient Instructions (Signed)
  VISIT SUMMARY: You came in today because of acute right shoulder pain that has been radiating to your neck for the past six days. The pain started after lifting a heavy object and has been affecting your sleep and daily activities. You have tried various over-the-counter treatments with minimal relief. You also have a history of reflux disease, which we discussed during your visit.  YOUR PLAN: -ACUTE RIGHT SHOULDER PAIN: Your shoulder pain is likely due to a muscle strain from lifting a heavy object. This type of pain can be throbbing and may worsen with certain movements or during sleep. To help manage the pain, you received an injection of ketorolac  for immediate relief and were prescribed oral ketorolac  to take every six hours as needed. Additionally, you were prescribed cyclobenzaprine , a muscle relaxant, to take at night. You should consider light duty at work if needed, and a note for work accommodations was provided. If the pain persists beyond a week, please return for further evaluation and potential imaging.  -GASTROESOPHAGEAL REFLUX DISEASE (GERD): GERD is a condition where stomach acid frequently flows back into the tube connecting your mouth and stomach, which can cause discomfort. Since ketorolac  can exacerbate GERD symptoms, you were prescribed pantoprazole  to take daily for one week to protect your stomach.  INSTRUCTIONS: Please take the prescribed medications as directed. If your shoulder pain persists beyond a week, return for further evaluation and potential imaging. Additionally, follow the advice on light duty at work if needed, and use the provided note for work accommodations.

## 2024-02-21 ENCOUNTER — Ambulatory Visit: Admitting: Family Medicine

## 2024-05-11 ENCOUNTER — Other Ambulatory Visit: Payer: Self-pay | Admitting: Family Medicine

## 2024-05-11 ENCOUNTER — Other Ambulatory Visit: Payer: Self-pay | Admitting: Nurse Practitioner

## 2024-05-11 DIAGNOSIS — M25511 Pain in right shoulder: Secondary | ICD-10-CM

## 2024-05-11 NOTE — Telephone Encounter (Signed)
 Requesting: LOSARTAN  POTASSIUM 25 MG TAB  Last Visit: Visit date not found Next Visit: Visit date not found Last Refill: 02/13/2024  Please Advise    I tried to call patient at number listed and he was unavailable and could not leave message.

## 2024-05-11 NOTE — Telephone Encounter (Signed)
Patient needs an appointment for future refills. 

## 2024-05-20 ENCOUNTER — Other Ambulatory Visit: Payer: Self-pay | Admitting: Nurse Practitioner

## 2024-05-22 NOTE — Telephone Encounter (Signed)
 Requesting: LOSARTAN  POTASSIUM 25 MG TAB  Last Visit: Visit date not found Next Visit: Visit date not found Last Refill: 8/18/20025  Please Advise   Patient needs an appointment for future refills

## 2024-06-11 ENCOUNTER — Encounter: Payer: Self-pay | Admitting: Emergency Medicine

## 2024-06-11 ENCOUNTER — Ambulatory Visit: Payer: Self-pay

## 2024-06-11 ENCOUNTER — Ambulatory Visit: Admitting: Emergency Medicine

## 2024-06-11 VITALS — BP 132/72 | HR 86 | Temp 97.6°F | Resp 18 | Ht 69.0 in | Wt 164.0 lb

## 2024-06-11 DIAGNOSIS — J069 Acute upper respiratory infection, unspecified: Secondary | ICD-10-CM

## 2024-06-11 DIAGNOSIS — J029 Acute pharyngitis, unspecified: Secondary | ICD-10-CM

## 2024-06-11 LAB — POCT RAPID STREP A (OFFICE): Rapid Strep A Screen: NEGATIVE

## 2024-06-11 LAB — POCT INFLUENZA A/B
Influenza A, POC: NEGATIVE
Influenza B, POC: NEGATIVE

## 2024-06-11 MED ORDER — IPRATROPIUM BROMIDE 0.06 % NA SOLN: 2.0000 | Freq: Three times a day (TID) | NASAL | 0 refills | Status: AC | PRN

## 2024-06-11 NOTE — Telephone Encounter (Signed)
 Noted. Patient scheduled for 06/11/24 with [name], [facility]

## 2024-06-11 NOTE — Progress Notes (Signed)
 Assessment & Plan:   Assessment & Plan Sore throat Negative flu and strep  Orders:   POCT rapid strep A   POCT Influenza A/B  Acute URI Improving symptoms Lungs clear VS reassuring Supportive care reviewed OK for flu vaccine end of this week as long as he continues to improve Orders:   ipratropium (ATROVENT ) 0.06 % nasal spray; Place 2 sprays into both nostrils 3 (three) times daily as needed for rhinitis.      Results for orders placed or performed in visit on 06/11/24  POCT rapid strep A  Result Value Ref Range   Rapid Strep A Screen Negative Negative    Patient Instructions  WHAT YOU SHOULD KNOW:   You have been diagnosed with a URI, an infection that can affect your nose, throat, ears, and sinuses, causing your upper respiratory system to become inflamed. Common symptoms include sneezing, dry throat, a stuffy nose, headache, watery eyes, and a cough. Your cough may be dry, or you may cough up mucus. You may also have muscle aches, joint pain, and tiredness. Rarely, you may have a fever. Your cough may be dry, or you may cough up mucus.     The average adult experiences 2-3 URIs per year and for healthy people, the illness is not usually serious and does not need special treatment. The symptoms are usually worst for the first 3 to 5 days, and most illnesses last 3-7 days, although many people continue to have symptoms (coughing, sneezing, congestion) for up to two weeks. You may continue to cough for up to 3 weeks.     Some viruses that cause URIs can also depress the immune system or cause swelling in the lining of the nose or airways; this can, in turn, lead to a new viral infection or bacterial infection.  One of the more common complications is sinusitis, which is usually caused by viruses and rarely (about 2 percent of the time) by bacteria. However, it can be difficult to distinguish bacterial sinusitis from sinusitis caused by a cold because the signs and symptoms  can be similar. Having thick or yellow to green-colored nasal discharge does not mean that bacterial sinusitis has developed; discolored nasal discharge is a normal phase of the common cold   Lower respiratory infections, such as pneumonia or bronchitis, may develop following a cold.  Infection of the middle ear, or otitis media, can accompany or follow a cold.    AFTER YOU LEAVE:   Medicines:  NSAIDs (such as ibuprofen  or naproxen) or acetaminophen  help to bring down a fever or decrease pain.  Decongestants help decrease nasal stuffiness. Decongestants can be pills (pseudoephedrine works best) or nasal sprays (oxymetazoline/afrin can be used twice a day for up to three days). If you have high blood pressure, check with the pharmacist for safe decongestants for you to take.  Antihistamines (such as benadryl , zyrtec, or claritin)  help decrease sneezing and a runny nose.   Nasal inhalers, including ipratropium bromide  (atrovent , available by prescription) may relieve runny nose and sneezing while steroid nasal sprays (like fluticasone/flonase) may help with swelling or underlying allergies.   Cough suppressants help decrease how much you cough. Dextromethorphan is an over-the-counter cough medication in products such as Delsym. You may have been given a prescription cough medication- be sure to follow the instructions on the bottle. Tessalon perles can help stop a cough if the impulse is coming from your lungs.  Expectorants (guaifenesin such as in mucinex or robiutussin)  help loosen mucus so you can cough it up.   Self-care:   Rest: Rest until your fever is gone and you feel better.   Stay hydrated:   Liquids will help thin and loosen thick mucus so you can cough it up. Drink 8 to 10 cups of liquids such as water, ginger ale, tea or fruit juices each day. If you are vomiting, good fluids include decaffeinated sports drinks.   Gargle: Gargle with warm salt water to help your sore throat feel better.  Make salt water by adding  teaspoon salt to 1 cup warm water. You may also suck on hard candy or throat lozenges. Benzocaine lozenges are numbing. Pectin (like in Luden's) are soothing. Menthol  drops (Halls and Ricola) are helpful if you have nasal congestion or frequent urge to cough.  Saline nasal drops: These can help relieve your congestion. They can be bought without a prescription. Saline rinses can help thin and clear mucous, NeilMed is my preferred brand  Take a warm bath or shower: This may help decrease body aches and help you breathe easier.   Use a humidifier: This will increase air moisture and make it easier for you to breathe. Cool mist humidifier should be used around small children for safety; otherwise choose cool or warm mist as you prefer.     Prevent spreading your cold to others:   Try to stay away from other people during the first 2 to 3 days of your cold when it is more easily spread.  Do not share food or drinks with anyone.   Do not share hand towels with household members.  Wash your hands often, especially after you blow your nose. Turn away from other people and cover your mouth and nose with a tissue when you sneeze or cough.    Contact your primary healthcare provider if:   Your symptoms get worse after 3 to 5 days or your cold is not better in 10 days.  You have a new rash anywhere on your skin.  You have large and tender lumps in your neck.  You have thick, green or yellow drainage from your nose for more than a few days.  You cough up thick yellow, green mucus for more than a few days or if you have any gray or bloody mucus.  You have vomiting for more than 24 hours and cannot keep fluids down.  You have a bad earache.  You have questions about your condition or care.    Go to the emergency department if:   You have a sudden/SEVERE headache or a new stiff neck You have chest pain or trouble breathing.  You cannot hold down liquids and are concerned you are  dehydrated You are struggling to stay awake    Bobby Griffith, MSPAS, PA-C   Subjective:  Cough (Cough, sore throat, fever, headache and runny nose x 3 days. Sx have improved since Friday. Interpreter [phone]) ASL interpreter utilized for entirety of visit  HPI: Bobby Griffith. is a 72 y.o. male presenting with acute illness, concern for flu Sx for 4 days Subjective fevers, headache, runny nose, ST, cough Overall improving Denies CP or SOB Vicks rub helps   ROS: Negative unless specifically indicated above in HPI.   Relevant past medical history reviewed and updated as indicated.   Allergies and medications reviewed and updated.  Current Medications[1]  Allergies[2]    Objective:   Vitals:   06/11/24 1432  BP: 132/72  Pulse: 86  Temp: 97.6 F (36.4 C)  Resp: 18  Height: 5' 9 (1.753 m)  Weight: 164 lb (74.4 kg)  SpO2: 96%  BMI (Calculated): 24.21      Gen: appears well, alert, INAD Ears: Right canal normal. Right TM dull, retracted. Left canal normal. Left TM dull, retracted.  Nose: mucosal congestion and clear rhinorrhea Mouth: Oral mucosa moist. Throat: red  Neck: supple and no adenopathy Heart RRR Lungs: Respiratory effort: normal.  clear to auscultation, no wheezes, rales, or rhonchi Skin: Warm and dry without acute rash to exposed areas.           [1]  Current Outpatient Medications:    ipratropium (ATROVENT ) 0.06 % nasal spray, Place 2 sprays into both nostrils 3 (three) times daily as needed for rhinitis., Disp: 15 mL, Rfl: 0   losartan  (COZAAR ) 25 MG tablet, TAKE 1 TABLET (25 MG TOTAL) BY MOUTH DAILY., Disp: 30 tablet, Rfl: 0   pantoprazole  (PROTONIX ) 40 MG tablet, TAKE 1 TABLET BY MOUTH EVERY DAY, Disp: 90 tablet, Rfl: 0   cyclobenzaprine  (FLEXERIL ) 10 MG tablet, Take 1 tablet (10 mg total) by mouth at bedtime as needed for muscle spasms. (Patient not taking: Reported on 06/11/2024), Disp: 30 tablet, Rfl: 0   ketorolac  (TORADOL ) 10 MG  tablet, Take 1 tablet (10 mg total) by mouth every 6 (six) hours as needed. (Patient not taking: Reported on 06/11/2024), Disp: 20 tablet, Rfl: 0   loperamide  (IMODIUM ) 2 MG capsule, Take 1 capsule (2 mg total) by mouth as needed for diarrhea or loose stools. (Patient not taking: Reported on 06/11/2024), Disp: 30 capsule, Rfl: 0 [2] No Known Allergies

## 2024-06-11 NOTE — Patient Instructions (Signed)
 WHAT YOU SHOULD KNOW:   You have been diagnosed with a URI, an infection that can affect your nose, throat, ears, and sinuses, causing your upper respiratory system to become inflamed. Common symptoms include sneezing, dry throat, a stuffy nose, headache, watery eyes, and a cough. Your cough may be dry, or you may cough up mucus. You may also have muscle aches, joint pain, and tiredness. Rarely, you may have a fever. Your cough may be dry, or you may cough up mucus.     The average adult experiences 2-3 URIs per year and for healthy people, the illness is not usually serious and does not need special treatment. The symptoms are usually worst for the first 3 to 5 days, and most illnesses last 3-7 days, although many people continue to have symptoms (coughing, sneezing, congestion) for up to two weeks. You may continue to cough for up to 3 weeks.     Some viruses that cause URIs can also depress the immune system or cause swelling in the lining of the nose or airways; this can, in turn, lead to a new viral infection or bacterial infection.  One of the more common complications is sinusitis, which is usually caused by viruses and rarely (about 2 percent of the time) by bacteria. However, it can be difficult to distinguish bacterial sinusitis from sinusitis caused by a cold because the signs and symptoms can be similar. Having thick or yellow to green-colored nasal discharge does not mean that bacterial sinusitis has developed; discolored nasal discharge is a normal phase of the common cold   Lower respiratory infections, such as pneumonia or bronchitis, may develop following a cold.  Infection of the middle ear, or otitis media, can accompany or follow a cold.    AFTER YOU LEAVE:   Medicines:  NSAIDs (such as ibuprofen  or naproxen) or acetaminophen  help to bring down a fever or decrease pain.  Decongestants help decrease nasal stuffiness. Decongestants can be pills (pseudoephedrine works best) or nasal  sprays (oxymetazoline/afrin can be used twice a day for up to three days). If you have high blood pressure, check with the pharmacist for safe decongestants for you to take.  Antihistamines (such as benadryl , zyrtec, or claritin)  help decrease sneezing and a runny nose.   Nasal inhalers, including ipratropium bromide  (atrovent , available by prescription) may relieve runny nose and sneezing while steroid nasal sprays (like fluticasone/flonase) may help with swelling or underlying allergies.   Cough suppressants help decrease how much you cough. Dextromethorphan is an over-the-counter cough medication in products such as Delsym. You may have been given a prescription cough medication- be sure to follow the instructions on the bottle. Tessalon perles can help stop a cough if the impulse is coming from your lungs.  Expectorants (guaifenesin such as in mucinex or robiutussin)  help loosen mucus so you can cough it up.   Self-care:   Rest: Rest until your fever is gone and you feel better.   Stay hydrated:   Liquids will help thin and loosen thick mucus so you can cough it up. Drink 8 to 10 cups of liquids such as water, ginger ale, tea or fruit juices each day. If you are vomiting, good fluids include decaffeinated sports drinks.   Gargle: Gargle with warm salt water to help your sore throat feel better. Make salt water by adding  teaspoon salt to 1 cup warm water. You may also suck on hard candy or throat lozenges. Benzocaine lozenges are numbing. Pectin (like  in Luden's) are soothing. Menthol  drops (Halls and Ricola) are helpful if you have nasal congestion or frequent urge to cough.  Saline nasal drops: These can help relieve your congestion. They can be bought without a prescription. Saline rinses can help thin and clear mucous, NeilMed is my preferred brand  Take a warm bath or shower: This may help decrease body aches and help you breathe easier.   Use a humidifier: This will increase air moisture and  make it easier for you to breathe. Cool mist humidifier should be used around small children for safety; otherwise choose cool or warm mist as you prefer.     Prevent spreading your cold to others:   Try to stay away from other people during the first 2 to 3 days of your cold when it is more easily spread.  Do not share food or drinks with anyone.   Do not share hand towels with household members.  Wash your hands often, especially after you blow your nose. Turn away from other people and cover your mouth and nose with a tissue when you sneeze or cough.    Contact your primary healthcare provider if:   Your symptoms get worse after 3 to 5 days or your cold is not better in 10 days.  You have a new rash anywhere on your skin.  You have large and tender lumps in your neck.  You have thick, green or yellow drainage from your nose for more than a few days.  You cough up thick yellow, green mucus for more than a few days or if you have any gray or bloody mucus.  You have vomiting for more than 24 hours and cannot keep fluids down.  You have a bad earache.  You have questions about your condition or care.    Go to the emergency department if:   You have a sudden/SEVERE headache or a new stiff neck You have chest pain or trouble breathing.  You cannot hold down liquids and are concerned you are dehydrated You are struggling to stay awake

## 2024-06-11 NOTE — Telephone Encounter (Signed)
 FYI Only or Action Required?: FYI only for provider: appointment scheduled on 12/15.  Patient was last seen in primary care on 02/15/2024 by Sebastian Beverley NOVAK, MD.  Called Nurse Triage reporting Cough.  Symptoms began several days ago.  Interventions attempted: Nothing.  Symptoms are: unchanged.  Triage Disposition: See Physician Within 24 Hours  Patient/caregiver understands and will follow disposition?: Yes                   Copied from CRM #[id number]. Topic: Clinical - Red Word Triage >> Jun 11, 2024  9:00 AM Roselie BROCKS wrote: [facility] that prompted transfer to Nurse Triage: Patient is with a sign language interpreter.. patient states he feels like he has the flu, cough with phlegm,fever, runny nose,and achy all over. >> Jun 11, 2024  9:41 AM Rosina BIRCH wrote: Patient is with a sign language interpreter and stated he was disconnected from getting the nurse >> Jun 11, 2024  9:20 AM Roselie BROCKS wrote: Patient disconnected while holding, no answer on call back. Sign language interpreter needed.  Reason for Disposition  [1] Continuous (nonstop) coughing interferes with work or school AND [2] no improvement using cough treatment per Care Advice  Answer Assessment - Initial Assessment Questions 1. ONSET: When did the cough begin?      X 4 days   2. SEVERITY: How bad is the cough today?      Intermittent   3. SPUTUM: Describe the color of your sputum (e.g., none, dry cough; clear, white, yellow, green)     Dry cough   4. HEMOPTYSIS: Are you coughing up any blood? If Yes, ask: How much? (e.g., flecks, streaks, tablespoons, etc.)     No   5. DIFFICULTY BREATHING: Are you having difficulty breathing? If Yes, ask: How bad is it? (e.g., mild, moderate, severe)      No   6. FEVER: Do you have a fever? If Yes, ask: What is your temperature, how was it measured, and when did it start?     Unsure   7. CARDIAC HISTORY: Do you have any history of heart  disease? (e.g., heart attack, congestive heart failure)      No   8. LUNG HISTORY: Do you have any history of lung disease?  (e.g., pulmonary embolus, asthma, emphysema)    No   10. OTHER SYMPTOMS: Do you have any other symptoms? (e.g., runny nose, wheezing, chest pain)  Runny nose.    Patient called in to triage with complaints of dry cough, runny nose. This has been ongoing for x 4 days  The patient stated he feels like he has the Flu. For home care, the patient is not taking any OTC medications.   Appointment scheduled for further evaluation; and agrees with the plan of care, and will reach out if symptoms worsen or persist.  Protocols used: Cough - Acute Productive-A-AH

## 2024-06-26 ENCOUNTER — Other Ambulatory Visit: Payer: Self-pay | Admitting: Nurse Practitioner

## 2024-06-26 NOTE — Telephone Encounter (Signed)
 Requesting: LOSARTAN  POTASSIUM 25 MG TAB  Last Visit: Visit date not found Next Visit: Visit date not found Last Refill: 05/22/24  Please Advise    Pharmacy comment: REQUEST FOR 90 DAYS PRESCRIPTION.

## 2024-06-30 ENCOUNTER — Other Ambulatory Visit: Payer: Self-pay | Admitting: Nurse Practitioner

## 2024-07-03 NOTE — Telephone Encounter (Signed)
 Requesting: LOSARTAN  POTASSIUM 25 MG TAB  Last Visit: 04/06/2023 Next Visit: Visit date not found Last Refill: 05/22/2024  Please Advise

## 2024-07-03 NOTE — Telephone Encounter (Signed)
Left message for patient to call the office and schedule an appointment.

## 2024-07-17 NOTE — Telephone Encounter (Signed)
 I called patient's brother and left him contact information to have his brother to call the office and schedule an appointment. Patient is well over due for an appointment.

## 2024-07-21 ENCOUNTER — Other Ambulatory Visit: Payer: Self-pay | Admitting: Nurse Practitioner

## 2024-07-24 NOTE — Telephone Encounter (Signed)
 I called patient twice and he picked up phone and then hung up.

## 2024-07-24 NOTE — Telephone Encounter (Signed)
 I called [facility] and spoke with pharmacist to check to see if they have a different contact number for patient and they have all zeros for phone number for patient.

## 2024-07-31 ENCOUNTER — Telehealth: Payer: Self-pay | Admitting: Nurse Practitioner

## 2024-07-31 NOTE — Telephone Encounter (Signed)
 Left message to call the office to reschedule appointment and to also verify which medication that he wants refilled.

## 2024-07-31 NOTE — Telephone Encounter (Signed)
 Copied from CRM #[id number]. Topic: Appointments - Scheduling Inquiry for Clinic >> Jul 30, 2024 11:58 AM Drema MATSU wrote: Reason for CRM: Patient has scheduled appt for a med refill. Patient stated that he can't go that long without medication. Pt wants to know if he can get a few to last until his appt. He wants a morning appt and there is none available  until March 4th. He needs something around 7 in the morning. Pt has to work and can't do 2:00pm on 02/19. Please leave pt a message to let him know when.

## 2024-08-01 NOTE — Telephone Encounter (Signed)
 Left message to call the office to reschedule appointment and to also verify which medication that he wants refilled.

## 2024-08-02 NOTE — Telephone Encounter (Signed)
 Left message for patient to call the office back to reschedule appointment and to also notify office of medication request.

## 2024-08-16 ENCOUNTER — Ambulatory Visit: Admitting: Nurse Practitioner
